# Patient Record
Sex: Female | Born: 1990
Health system: Southern US, Community
[De-identification: ages and names within clinical notes are randomized; demographics above are authoritative.]

## PROBLEM LIST (undated history)

## (undated) DIAGNOSIS — Z3490 Encounter for supervision of normal pregnancy, unspecified, unspecified trimester: Secondary | ICD-10-CM

## (undated) DIAGNOSIS — Z789 Other specified health status: Secondary | ICD-10-CM

## (undated) DIAGNOSIS — R87629 Unspecified abnormal cytological findings in specimens from vagina: Secondary | ICD-10-CM

## (undated) DIAGNOSIS — O4690 Antepartum hemorrhage, unspecified, unspecified trimester: Secondary | ICD-10-CM

## (undated) HISTORY — DX: Unspecified abnormal cytological findings in specimens from vagina: R87.629

## (undated) HISTORY — PX: WISDOM TOOTH EXTRACTION: SHX21

## (undated) HISTORY — DX: Other specified health status: Z78.9

## (undated) HISTORY — DX: Antepartum hemorrhage, unspecified, unspecified trimester: O46.90

## (undated) HISTORY — DX: Encounter for supervision of normal pregnancy, unspecified, unspecified trimester: Z34.90

---

## 2010-12-02 ENCOUNTER — Emergency Department (HOSPITAL_COMMUNITY)
Admission: EM | Admit: 2010-12-02 | Discharge: 2010-12-02 | Disposition: A | Discharge status: Home / Self Care | Payer: Self-pay | Attending: Emergency Medicine | Admitting: Emergency Medicine

## 2010-12-02 DIAGNOSIS — H6500 Acute serous otitis media, unspecified ear: Secondary | ICD-10-CM | POA: Insufficient documentation

## 2010-12-02 DIAGNOSIS — H9209 Otalgia, unspecified ear: Secondary | ICD-10-CM | POA: Insufficient documentation

## 2015-02-09 ENCOUNTER — Encounter: Payer: Self-pay | Admitting: Adult Health

## 2015-02-10 ENCOUNTER — Ambulatory Visit (INDEPENDENT_AMBULATORY_CARE_PROVIDER_SITE_OTHER): Payer: BLUE CROSS/BLUE SHIELD | Admitting: Adult Health

## 2015-02-10 ENCOUNTER — Encounter: Payer: Self-pay | Admitting: Adult Health

## 2015-02-10 VITALS — BP 130/60 | HR 96 | Ht 64.0 in | Wt 176.0 lb

## 2015-02-10 DIAGNOSIS — N926 Irregular menstruation, unspecified: Secondary | ICD-10-CM

## 2015-02-10 DIAGNOSIS — Z3201 Encounter for pregnancy test, result positive: Secondary | ICD-10-CM | POA: Diagnosis not present

## 2015-02-10 DIAGNOSIS — Z3400 Encounter for supervision of normal first pregnancy, unspecified trimester: Secondary | ICD-10-CM | POA: Insufficient documentation

## 2015-02-10 DIAGNOSIS — Z3490 Encounter for supervision of normal pregnancy, unspecified, unspecified trimester: Secondary | ICD-10-CM

## 2015-02-10 DIAGNOSIS — O3680X0 Pregnancy with inconclusive fetal viability, not applicable or unspecified: Secondary | ICD-10-CM

## 2015-02-10 HISTORY — DX: Encounter for supervision of normal pregnancy, unspecified, unspecified trimester: Z34.90

## 2015-02-10 LAB — POCT URINE PREGNANCY: Preg Test, Ur: POSITIVE — AB

## 2015-02-10 MED ORDER — PRENATAL PLUS 27-1 MG PO TABS: 1.0000 | ORAL_TABLET | Freq: Every day | ORAL | Status: DC

## 2015-02-10 NOTE — Progress Notes (Signed)
Subjective:     Patient ID: Lynn Hale, female   DOB: [birth year], 24 y.o.   MRN: [id number]  HPI Lynn Hale is a 24 year old white female, married in for UPT, has missed a period and had 2  +HPT.Has had some loose BMs in last week or so, no bleeding, some cramps and nausea.  Review of Systems Patient denies any headaches, hearing loss, fatigue, blurred vision, shortness of breath, chest pain, abdominal pain, problems with  urination, or intercourse. No joint pain or mood swings.See HPI for positives. Reviewed past medical,surgical, social and family history. Reviewed medications and allergies.     Objective:   Physical Exam BP 130/60 mmHg  Pulse 96  Ht 5\' 4"  (1.626 m)  Wt 176 lb (79.833 kg)  BMI 30.20 kg/m2  LMP 01/09/2015 UPT +, about 4+4 weeks by LMP EDD 10/16/15, Skin warm and dry. Neck: mid line trachea, normal thyroid, good ROM, no lymphadenopathy noted. Lungs: clear to ausculation bilaterally. Cardiovascular: regular rate and rhythm.abdomen soft, non tender, ask about cruise in October to [place] told them not to go due to [place] virus.    Assessment:     Pregnant     Plan:    Eat every 2 hours, try lemon drops  Return in 2 weeks for dating [place] Review handout on first trimester and new OB packet given Rx prenatal plus #30 take 1 daily with 11 refills Ok to take tums,tylenol and imodium if needed

## 2015-02-10 NOTE — Patient Instructions (Signed)
First Trimester of Pregnancy The first trimester of pregnancy is from week 1 until the end of week 12 (months 1 through 3). A week after a sperm fertilizes an egg, the egg will implant on the wall of the uterus. This embryo will begin to develop into a baby. Genes from you and your partner are forming the baby. The female genes determine whether the baby is a boy or a girl. At 6-8 weeks, the eyes and face are formed, and the heartbeat can be seen on ultrasound. At the end of 12 weeks, all the baby's organs are formed.  Now that you are pregnant, you will want to do everything you can to have a healthy baby. Two of the most important things are to get good prenatal care and to follow your health care provider's instructions. Prenatal care is all the medical care you receive before the baby's birth. This care will help prevent, find, and treat any problems during the pregnancy and childbirth. BODY CHANGES Your body goes through many changes during pregnancy. The changes vary from woman to woman.   You may gain or lose a couple of pounds at first.  You may feel sick to your stomach (nauseous) and throw up (vomit). If the vomiting is uncontrollable, call your health care provider.  You may tire easily.  You may develop headaches that can be relieved by medicines approved by your health care provider.  You may urinate more often. Painful urination may mean you have a bladder infection.  You may develop heartburn as a result of your pregnancy.  You may develop constipation because certain hormones are causing the muscles that push waste through your intestines to slow down.  You may develop hemorrhoids or swollen, bulging veins (varicose veins).  Your breasts may begin to grow larger and become tender. Your nipples may stick out more, and the tissue that surrounds them (areola) may become darker.  Your gums may bleed and may be sensitive to brushing and flossing.  Dark spots or blotches (chloasma,  mask of pregnancy) may develop on your face. This will likely fade after the baby is born.  Your menstrual periods will stop.  You may have a loss of appetite.  You may develop cravings for certain kinds of food.  You may have changes in your emotions from day to day, such as being excited to be pregnant or being concerned that something may go wrong with the pregnancy and baby.  You may have more vivid and strange dreams.  You may have changes in your hair. These can include thickening of your hair, rapid growth, and changes in texture. Some women also have hair loss during or after pregnancy, or hair that feels dry or thin. Your hair will most likely return to normal after your baby is born. WHAT TO EXPECT AT YOUR PRENATAL VISITS During a routine prenatal visit:  You will be weighed to make sure you and the baby are growing normally.  Your blood pressure will be taken.  Your abdomen will be measured to track your baby's growth.  The fetal heartbeat will be listened to starting around week 10 or 12 of your pregnancy.  Test results from any previous visits will be discussed. Your health care provider may ask you:  How you are feeling.  If you are feeling the baby move.  If you have had any abnormal symptoms, such as leaking fluid, bleeding, severe headaches, or abdominal cramping.  If you have any questions. Other tests   that may be performed during your first trimester include:  Blood tests to find your blood type and to check for the presence of any previous infections. They will also be used to check for low iron levels (anemia) and Rh antibodies. Later in the pregnancy, blood tests for diabetes will be done along with other tests if problems develop.  Urine tests to check for infections, diabetes, or protein in the urine.  An ultrasound to confirm the proper growth and development of the baby.  An amniocentesis to check for possible genetic problems.  Fetal screens for  spina bifida and Down syndrome.  You may need other tests to make sure you and the baby are doing well. HOME CARE INSTRUCTIONS  Medicines  Follow your health care provider's instructions regarding medicine use. Specific medicines may be either safe or unsafe to take during pregnancy.  Take your prenatal vitamins as directed.  If you develop constipation, try taking a stool softener if your health care provider approves. Diet  Eat regular, well-balanced meals. Choose a variety of foods, such as meat or vegetable-based protein, fish, milk and low-fat dairy products, vegetables, fruits, and whole grain breads and cereals. Your health care provider will help you determine the amount of weight gain that is right for you.  Avoid raw meat and uncooked cheese. These carry germs that can cause birth defects in the baby.  Eating four or five small meals rather than three large meals a day may help relieve nausea and vomiting. If you start to feel nauseous, eating a few soda crackers can be helpful. Drinking liquids between meals instead of during meals also seems to help nausea and vomiting.  If you develop constipation, eat more high-fiber foods, such as fresh vegetables or fruit and whole grains. Drink enough fluids to keep your urine clear or pale yellow. Activity and Exercise  Exercise only as directed by your health care provider. Exercising will help you:  Control your weight.  Stay in shape.  Be prepared for labor and delivery.  Experiencing pain or cramping in the lower abdomen or low back is a good sign that you should stop exercising. Check with your health care provider before continuing normal exercises.  Try to avoid standing for long periods of time. Move your legs often if you must stand in one place for a long time.  Avoid heavy lifting.  Wear low-heeled shoes, and practice good posture.  You may continue to have sex unless your health care provider directs you  otherwise. Relief of Pain or Discomfort  Wear a good support bra for breast tenderness.   Take warm sitz baths to soothe any pain or discomfort caused by hemorrhoids. Use hemorrhoid cream if your health care provider approves.   Rest with your legs elevated if you have leg cramps or low back pain.  If you develop varicose veins in your legs, wear support hose. Elevate your feet for 15 minutes, 3-4 times a day. Limit salt in your diet. Prenatal Care  Schedule your prenatal visits by the twelfth week of pregnancy. They are usually scheduled monthly at first, then more often in the last 2 months before delivery.  Write down your questions. Take them to your prenatal visits.  Keep all your prenatal visits as directed by your health care provider. Safety  Wear your seat belt at all times when driving.  Make a list of emergency phone numbers, including numbers for family, friends, the hospital, and police and fire departments. General Tips    Ask your health care provider for a referral to a local prenatal education class. Begin classes no later than at the beginning of month 6 of your pregnancy.  Ask for help if you have counseling or nutritional needs during pregnancy. Your health care provider can offer advice or refer you to specialists for help with various needs.  Do not use hot tubs, steam rooms, or saunas.  Do not douche or use tampons or scented sanitary pads.  Do not cross your legs for long periods of time.  Avoid cat litter boxes and soil used by cats. These carry germs that can cause birth defects in the baby and possibly loss of the fetus by miscarriage or stillbirth.  Avoid all smoking, herbs, alcohol, and medicines not prescribed by your health care provider. Chemicals in these affect the formation and growth of the baby.  Schedule a dentist appointment. At home, brush your teeth with a soft toothbrush and be gentle when you floss. SEEK MEDICAL CARE IF:   You have  dizziness.  You have mild pelvic cramps, pelvic pressure, or nagging pain in the abdominal area.  You have persistent nausea, vomiting, or diarrhea.  You have a bad smelling vaginal discharge.  You have pain with urination.  You notice increased swelling in your face, hands, legs, or ankles. SEEK IMMEDIATE MEDICAL CARE IF:   You have a fever.  You are leaking fluid from your vagina.  You have spotting or bleeding from your vagina.  You have severe abdominal cramping or pain.  You have rapid weight gain or loss.  You vomit blood or material that looks like coffee grounds.  You are exposed to [place] measles and have never had them.  You are exposed to fifth disease or chickenpox.  You develop a severe headache.  You have shortness of breath.  You have any kind of trauma, such as from a fall or a car accident. Document Released: 07/25/2001 Document Revised: 12/15/2013 Document Reviewed: 06/10/2013 [facility] Patient Information 2015 [place], [place]. This information is not intended to replace advice given to you by your health care provider. Make sure you discuss any questions you have with your health care provider. Return in 2 weeks for dating [place]

## 2015-02-16 ENCOUNTER — Telehealth: Payer: Self-pay | Admitting: Adult Health

## 2015-02-16 NOTE — Telephone Encounter (Signed)
Had lite spotting to come in tomorrow , no sex

## 2015-02-17 ENCOUNTER — Encounter: Payer: Self-pay | Admitting: Adult Health

## 2015-02-17 ENCOUNTER — Ambulatory Visit (INDEPENDENT_AMBULATORY_CARE_PROVIDER_SITE_OTHER): Payer: BLUE CROSS/BLUE SHIELD | Admitting: Adult Health

## 2015-02-17 VITALS — BP 110/72 | HR 92 | Ht 64.0 in | Wt 174.0 lb

## 2015-02-17 DIAGNOSIS — Z1389 Encounter for screening for other disorder: Secondary | ICD-10-CM

## 2015-02-17 DIAGNOSIS — Z331 Pregnant state, incidental: Secondary | ICD-10-CM | POA: Diagnosis not present

## 2015-02-17 DIAGNOSIS — O4691 Antepartum hemorrhage, unspecified, first trimester: Secondary | ICD-10-CM

## 2015-02-17 DIAGNOSIS — O200 Threatened abortion: Secondary | ICD-10-CM

## 2015-02-17 DIAGNOSIS — O209 Hemorrhage in early pregnancy, unspecified: Secondary | ICD-10-CM

## 2015-02-17 DIAGNOSIS — O4690 Antepartum hemorrhage, unspecified, unspecified trimester: Secondary | ICD-10-CM | POA: Insufficient documentation

## 2015-02-17 HISTORY — DX: Antepartum hemorrhage, unspecified, unspecified trimester: O46.90

## 2015-02-17 LAB — POCT URINALYSIS DIPSTICK
Glucose, UA: NEGATIVE
Ketones, UA: NEGATIVE
NITRITE UA: NEGATIVE
Protein, UA: NEGATIVE

## 2015-02-17 NOTE — Progress Notes (Addendum)
Subjective:     Patient ID: [name], female   DOB: 08/06/1991, 24 y.o.   MRN: [id number]  HPI Meshia is a 24 year old white female, married in complaining of vaginal bleeding Monday night, stopped last night.She had +UPT last week, is about [redacted] weeks pregnant.  Review of Systems Patient denies any headaches, hearing loss, fatigue, blurred vision, shortness of breath, chest pain, abdominal pain, problems with bowel movements, urination, or intercourse. No joint pain or mood swings.See HPI for positives.   Reviewed past medical,surgical, social and family history. Reviewed medications and allergies.     Objective:   Physical Exam BP 110/72 mmHg  Pulse 92  Ht 5\' 4"  (1.626 m)  Wt 174 lb (78.926 kg)  BMI 29.85 kg/m2  LMP 01/09/2015 urine trace of blood and 1+ leuks, Skin warm and dry.Pelvic: external genitalia is normal in appearance no lesions, vagina: tan discharge without odor,urethra has no lesions or masses noted, cervix is everted at os, closed, uterus: normal size, shape and contour, non tender, no masses felt, adnexa: no masses or tenderness noted. Bladder is non tender and no masses felt. US shows IUP with YS and fetal pole of about 6 weeks, +FHM 124, No [place] seen.    Assessment:    Vaginal bleeding in first trimester    Plan:     No sex, no straining or lifting over 25 lbs Return as scheduled for [place] 7/13

## 2015-02-17 NOTE — Patient Instructions (Signed)
No sex or straining No lifting over 25 lbs Keep appt as scheduled

## 2015-02-18 ENCOUNTER — Telehealth: Payer: Self-pay | Admitting: Adult Health

## 2015-02-18 MED ORDER — PROMETHAZINE HCL 25 MG PO TABS: 25.0000 mg | ORAL_TABLET | Freq: Four times a day (QID) | ORAL | Status: DC | PRN

## 2015-02-18 NOTE — Telephone Encounter (Signed)
Left message phenergan called in

## 2015-02-18 NOTE — Telephone Encounter (Signed)
Spoke with pt. Pt is requesting med for nausea. Thanks! [place]

## 2015-02-21 ENCOUNTER — Encounter (HOSPITAL_COMMUNITY): Payer: Self-pay

## 2015-02-21 DIAGNOSIS — Z3A01 Less than 8 weeks gestation of pregnancy: Secondary | ICD-10-CM | POA: Insufficient documentation

## 2015-02-21 DIAGNOSIS — O3481 Maternal care for other abnormalities of pelvic organs, first trimester: Secondary | ICD-10-CM | POA: Insufficient documentation

## 2015-02-21 DIAGNOSIS — N8329 Other ovarian cysts: Secondary | ICD-10-CM | POA: Insufficient documentation

## 2015-02-21 DIAGNOSIS — Z79899 Other long term (current) drug therapy: Secondary | ICD-10-CM | POA: Diagnosis not present

## 2015-02-21 LAB — COMPREHENSIVE METABOLIC PANEL
ALT: 11 U/L — AB (ref 14–54)
ANION GAP: 8 (ref 5–15)
AST: 14 U/L — ABNORMAL LOW (ref 15–41)
Albumin: 3.9 g/dL (ref 3.5–5.0)
Alkaline Phosphatase: 43 U/L (ref 38–126)
BUN: 8 mg/dL (ref 6–20)
CO2: 23 mmol/L (ref 22–32)
Calcium: 9 mg/dL (ref 8.9–10.3)
Chloride: 104 mmol/L (ref 101–111)
Creatinine, Ser: 0.53 mg/dL (ref 0.44–1.00)
Glucose, Bld: 92 mg/dL (ref 65–99)
Potassium: 3.8 mmol/L (ref 3.5–5.1)
SODIUM: 135 mmol/L (ref 135–145)
Total Bilirubin: 0.4 mg/dL (ref 0.3–1.2)
Total Protein: 6.9 g/dL (ref 6.5–8.1)

## 2015-02-21 LAB — URINALYSIS, ROUTINE W REFLEX MICROSCOPIC
BILIRUBIN URINE: NEGATIVE
Glucose, UA: NEGATIVE mg/dL
Ketones, ur: NEGATIVE mg/dL
Nitrite: NEGATIVE
PROTEIN: NEGATIVE mg/dL
Specific Gravity, Urine: 1.019 (ref 1.005–1.030)
Urobilinogen, UA: 0.2 mg/dL (ref 0.0–1.0)
pH: 6 (ref 5.0–8.0)

## 2015-02-21 LAB — CBC WITH DIFFERENTIAL/PLATELET
Basophils Absolute: 0 K/uL (ref 0.0–0.1)
Basophils Relative: 0 % (ref 0–1)
Eosinophils Absolute: 0.3 K/uL (ref 0.0–0.7)
Eosinophils Relative: 2 % (ref 0–5)
HCT: 38 % (ref 36.0–46.0)
Hemoglobin: 13.1 g/dL (ref 12.0–15.0)
Lymphocytes Relative: 29 % (ref 12–46)
Lymphs Abs: 3.1 K/uL (ref 0.7–4.0)
MCH: 31.6 pg (ref 26.0–34.0)
MCHC: 34.5 g/dL (ref 30.0–36.0)
MCV: 91.8 fL (ref 78.0–100.0)
Monocytes Absolute: 0.8 K/uL (ref 0.1–1.0)
Monocytes Relative: 7 % (ref 3–12)
Neutro Abs: 6.7 K/uL (ref 1.7–7.7)
Neutrophils Relative %: 62 % (ref 43–77)
Platelets: 255 K/uL (ref 150–400)
RBC: 4.14 MIL/uL (ref 3.87–5.11)
RDW: 12 % (ref 11.5–15.5)
WBC: 11 K/uL — ABNORMAL HIGH (ref 4.0–10.5)

## 2015-02-21 LAB — LIPASE, BLOOD: Lipase: 24 U/L (ref 22–51)

## 2015-02-21 LAB — URINE MICROSCOPIC-ADD ON

## 2015-02-21 NOTE — ED Notes (Signed)
Pt here for abd pain in right lower quadrant increased with standing, does not radiate, denies any vaginal bleeding this week, reports she is [redacted] weeks pregnant with first pregnancy

## 2015-02-21 NOTE — ED Notes (Addendum)
sts it does come and go similar to cramps but sharper in nature. Now reporting headache

## 2015-02-22 ENCOUNTER — Emergency Department (HOSPITAL_COMMUNITY)
Admission: EM | Admit: 2015-02-22 | Discharge: 2015-02-22 | Disposition: A | Discharge status: Home / Self Care | Payer: BLUE CROSS/BLUE SHIELD | Attending: Emergency Medicine | Admitting: Emergency Medicine

## 2015-02-22 ENCOUNTER — Emergency Department (HOSPITAL_COMMUNITY): Payer: BLUE CROSS/BLUE SHIELD

## 2015-02-22 DIAGNOSIS — N83201 Unspecified ovarian cyst, right side: Secondary | ICD-10-CM

## 2015-02-22 DIAGNOSIS — R1031 Right lower quadrant pain: Secondary | ICD-10-CM

## 2015-02-22 LAB — GC/CHLAMYDIA PROBE AMP (~~LOC~~) NOT AT ARMC
Chlamydia: NEGATIVE
NEISSERIA GONORRHEA: NEGATIVE

## 2015-02-22 LAB — WET PREP, GENITAL
Clue Cells Wet Prep HPF POC: NONE SEEN
Trich, Wet Prep: NONE SEEN
Yeast Wet Prep HPF POC: NONE SEEN

## 2015-02-22 MED ORDER — FOSFOMYCIN TROMETHAMINE 3 G PO PACK
3.0000 g | PACK | Freq: Once | ORAL | Status: AC
  Administered 2015-02-22: 3 g via ORAL
  Filled 2015-02-22: qty 3

## 2015-02-22 MED ORDER — ACETAMINOPHEN 500 MG PO TABS
1000.0000 mg | ORAL_TABLET | Freq: Once | ORAL | Status: AC
  Administered 2015-02-22: 1000 mg via ORAL
  Filled 2015-02-22: qty 2

## 2015-02-22 NOTE — ED Provider Notes (Signed)
CSN: [id number]     Arrival date & time 02/21/15  2139 History   This chart was scribed for Lynn Balls, MD by Forrestine Him, ED Scribe. This patient was seen in room D35C/D35C and the patient's care was started 12:51 AM.   Chief Complaint  Patient presents with  . Abdominal Pain   The history is provided by the patient. No language interpreter was used.    HPI Comments: Lynn Hale, G1P0, [redacted] weeks gestation is a 24 y.o. female who presents to the Emergency Department complaining of intermittent, ongoing, non-radiating, unchanged lower abdominal pain onset 3 PM this afternoon. Pain is described as sharp and similar to a cramp. Pt called on call OB/GYN nurse and was advised to come in to emergency department for further evaluation. No OTC medications or home remedies attempted prior to arrival. No vaginal discharge or vaginal bleeding. No leakage noted. No fever, chills, nausea, or vomiting. No known allergies to medications.  Past Medical History  Diagnosis Date  . Pregnant 02/10/2015  . Vaginal bleeding in pregnancy 02/17/2015   Past Surgical History  Procedure Laterality Date  . Wisdom tooth extraction     Family History  Problem Relation Age of Onset  . Cancer Mother     ovarian  . Diabetes Maternal Grandmother   . Glaucoma Paternal Grandmother   . Dementia Paternal Grandfather    History  Substance Use Topics  . Smoking status: Never Smoker   . Smokeless tobacco: Never Used  . Alcohol Use: No     Comment: not now   OB History    Gravida Para Term Preterm AB TAB SAB Ectopic Multiple Living   1              Review of Systems  Constitutional: Negative for fever and chills.  Respiratory: Negative for cough and shortness of breath.   Cardiovascular: Negative for chest pain.  Gastrointestinal: Positive for abdominal pain. Negative for nausea, vomiting and diarrhea.  Genitourinary: Negative for vaginal bleeding and vaginal discharge.  Neurological: Negative for  dizziness, weakness and numbness.  Psychiatric/Behavioral: Negative for confusion.  All other systems reviewed and are negative.     Allergies  Review of patient's allergies indicates no known allergies.  Home Medications   Prior to Admission medications   Medication Sig Start Date End Date Taking? Authorizing Provider  prenatal vitamin w/FE, FA (PRENATAL 1 + 1) 27-1 MG TABS tablet Take 1 tablet by mouth daily at 12 noon. 02/10/15   Estill Dooms, NP  promethazine (PHENERGAN) 25 MG tablet Take 1 tablet (25 mg total) by mouth every 6 (six) hours as needed for nausea or vomiting. 02/18/15   Estill Dooms, NP   Triage Vitals: BP 123/64 mmHg  Pulse 91  Temp(Src) 98.2 F (36.8 C) (Oral)  Resp 18  Ht 5\' 4"  (1.626 m)  Wt 174 lb (78.926 kg)  BMI 29.85 kg/m2  SpO2 100%  LMP 01/09/2015   Physical Exam  Constitutional: She is oriented to person, place, and time. She appears well-developed and well-nourished. No distress.  HENT:  Head: Normocephalic and atraumatic.  Nose: Nose normal.  Mouth/Throat: Oropharynx is clear and moist. No oropharyngeal exudate.  Eyes: Conjunctivae and EOM are normal. Pupils are equal, round, and reactive to light. No scleral icterus.  Neck: Normal range of motion. Neck supple. No JVD present. No tracheal deviation present. No thyromegaly present.  Cardiovascular: Normal rate, regular rhythm and normal heart sounds.  Exam reveals no gallop and  no friction rub.   No murmur heard. Pulmonary/Chest: Effort normal and breath sounds normal. No respiratory distress. She has no wheezes. She exhibits no tenderness.  Abdominal: Soft. Bowel sounds are normal. She exhibits no distension and no mass. There is tenderness. There is no rebound and no guarding.  RLQ tenderness to palpation   Genitourinary:  Mild brown vaginal discharge No CMT No bleeding Could assess cervical os as it was extremely posterior  No adnexal tenderness   Musculoskeletal: Normal range of  motion. She exhibits no edema or tenderness.  Lymphadenopathy:    She has no cervical adenopathy.  Neurological: She is alert and oriented to person, place, and time. No cranial nerve deficit. She exhibits normal muscle tone.  Skin: Skin is warm and dry. No rash noted. No erythema. No pallor.  Nursing note and vitals reviewed.   ED Course  Procedures (including critical care time)  DIAGNOSTIC STUDIES: Oxygen Saturation is 100% on RA, Normal by my interpretation.    COORDINATION OF CARE: 1:06 AM- Will order CMP, Lipase, CBC, urinalysis. Will give Tylenol. Will perform pelvic exam. Discussed treatment plan with pt at bedside and pt agreed to plan.     Labs Review Labs Reviewed  WET PREP, GENITAL - Abnormal; Notable for the following:    WBC, Wet Prep HPF POC MODERATE (*)    All other components within normal limits  COMPREHENSIVE METABOLIC PANEL - Abnormal; Notable for the following:    AST 14 (*)    ALT 11 (*)    All other components within normal limits  CBC WITH DIFFERENTIAL/PLATELET - Abnormal; Notable for the following:    WBC 11.0 (*)    All other components within normal limits  URINALYSIS, ROUTINE W REFLEX MICROSCOPIC (NOT AT [facility]) - Abnormal; Notable for the following:    Hgb urine dipstick TRACE (*)    Leukocytes, UA SMALL (*)    All other components within normal limits  URINE MICROSCOPIC-ADD ON - Abnormal; Notable for the following:    Squamous Epithelial / LPF FEW (*)    All other components within normal limits  LIPASE, BLOOD  GC/CHLAMYDIA PROBE AMP ([facility]) NOT AT [facility]    Imaging Review US Ob Comp Less 14 Wks  02/22/2015   CLINICAL DATA:  Vaginal bleeding, RIGHT lower quadrant pain, first trimester pregnancy. Gestational age by last menstrual period 6 weeks and 1 day.  EXAM: OBSTETRIC <14 WK [place] AND TRANSVAGINAL OB US  TECHNIQUE: Both transabdominal and transvaginal ultrasound examinations were performed for complete evaluation of the gestation as well as  the maternal uterus, adnexal regions, and pelvic cul-de-sac. Transvaginal technique was performed to assess early pregnancy.  COMPARISON:  None.  FINDINGS: Intrauterine gestational sac: Visualized/normal in shape.  Yolk sac:  Present  Embryo:  Present  Cardiac Activity: Present  Heart Rate: 155  bpm  CRL: 10 .4 mm 7 w 1 d [place] EDC: October 10, 2015  Maternal uterus/adnexae: No subchorionic hemorrhage. 4.7 cm anechoic RIGHT ovarian cyst with increased through transmission, no vascularity. Normal appearance of the LEFT adnexae. No free fluid.  IMPRESSION: Single live intrauterine pregnancy, gestational age by ultrasound 7 weeks and 1 day without immediate complications.  4.7 cm RIGHT ovarian simple cyst.   Electronically Signed   By: Elon Alas M.D.   On: 02/22/2015 02:32   US Ob Transvaginal  02/22/2015   CLINICAL DATA:  Vaginal bleeding, RIGHT lower quadrant pain, first trimester pregnancy. Gestational age by last menstrual period 6 weeks and  1 day.  EXAM: OBSTETRIC <14 WK [place] AND TRANSVAGINAL OB US  TECHNIQUE: Both transabdominal and transvaginal ultrasound examinations were performed for complete evaluation of the gestation as well as the maternal uterus, adnexal regions, and pelvic cul-de-sac. Transvaginal technique was performed to assess early pregnancy.  COMPARISON:  None.  FINDINGS: Intrauterine gestational sac: Visualized/normal in shape.  Yolk sac:  Present  Embryo:  Present  Cardiac Activity: Present  Heart Rate: 155  bpm  CRL: 10 .4 mm 7 w 1 d [place] EDC: October 10, 2015  Maternal uterus/adnexae: No subchorionic hemorrhage. 4.7 cm anechoic RIGHT ovarian cyst with increased through transmission, no vascularity. Normal appearance of the LEFT adnexae. No free fluid.  IMPRESSION: Single live intrauterine pregnancy, gestational age by ultrasound 7 weeks and 1 day without immediate complications.  4.7 cm RIGHT ovarian simple cyst.   Electronically Signed   By: Elon Alas M.D.   On: 02/22/2015 02:32    US Abdomen Limited  02/22/2015   CLINICAL DATA:  Right lower quadrant pain  EXAM: LIMITED ABDOMINAL ULTRASOUND  TECHNIQUE: Pearline Cables scale imaging of the right lower quadrant was performed to evaluate for suspected appendicitis. Standard imaging planes and graded compression technique were utilized.  COMPARISON:  None.  FINDINGS: The appendix is not visualized.  Ancillary findings: None. No fluid collections or inflammatory changes in the visualized field of view.  Factors affecting image quality: Body habitus related to current pregnancy.  IMPRESSION: Appendix is not identified and therefore is indeterminate. No specific evidence of infection is identified.   Electronically Signed   By: Lucienne Capers M.D.   On: 02/22/2015 02:55     EKG Interpretation None      MDM   Final diagnoses:  None   Patient presents to the emergency department for right lower quadrant abdominal pain since 3 PM intermittently. This is in the setting of 6 weeks pregnancy. Patient states she's had a normal ultrasound during this pregnancy. Pelvic exam shows abnormal discharge. Urinalysis shows rare bacteria. Will obtain abdominal ultrasound to assess for appendicitis and for live IUP. Patient was given Tylenol for pain. She will likely require antibiotics for asymptomatic bacteria in the setting of pregnancy.  Patient was given fosfomycin emergency department. Ultrasound does not identify the appendix, patient was educated on symptoms and told to return for repeat evaluation if she develops symptoms of appendicitis. Ultrasound reveals right ovarian cyst, this is likely the source of her pain. OB/GYN follow-up is advised in 3 days. She otherwise appears well in no acute distress. Her vital signs were within her normal limits and she is safe for discharge. Ultrasound also reveals live IUP at 7 weeks and 1 day.   I personally performed the services described in this documentation, which was scribed in my presence. The  recorded information has been reviewed and is accurate.   Lynn Balls, MD 02/22/15 [phone]

## 2015-02-22 NOTE — Discharge Instructions (Signed)
Ovarian Cyst Ms. Lynn Hale, your ultrasound results are below. See your OB/GYN physician within 3 days for close follow-up. If you develop symptoms of appendicitis such as fever and vomiting come back to the emergency department immediately. Thank you.  IMPRESSION: Single live intrauterine pregnancy, gestational age by ultrasound 7 weeks and 1 day without immediate complications.  4.7 cm RIGHT ovarian simple cyst.   An ovarian cyst is a sac filled with fluid or blood. This sac is attached to the ovary. Some cysts go away on their own. Other cysts need treatment.  HOME CARE   Only take medicine as told by your doctor.  Follow up with your doctor as told.  Get regular pelvic exams and Pap tests. GET HELP IF:  Your periods are late, not regular, or painful.  You stop having periods.  Your belly (abdominal) or pelvic pain does not go away.  Your belly becomes large or puffy (swollen).  You have a hard time peeing (totally emptying your bladder).  You have pressure on your bladder.  You have pain during sex.  You feel fullness, pressure, or discomfort in your belly.  You lose weight for no reason.  You feel sick most of the time.  You have a hard time pooping (constipation).  You do not feel like eating.  You develop pimples (acne).  You have an increase in hair on your body and face.  You are gaining weight for no reason.  You think you are pregnant. GET HELP RIGHT AWAY IF:   Your belly pain gets worse.  You feel sick to your stomach (nauseous), and you throw up (vomit).  You have a fever that comes on fast.  You have belly pain while pooping (bowel movement).  Your periods are heavier than usual. MAKE SURE YOU:   Understand these instructions.  Will watch your condition.  Will get help right away if you are not doing well or get worse. Document Released: 01/17/2008 Document Revised: 05/21/2013 Document Reviewed: 04/07/2013 [facility] Patient  Information 2015 [place], [place]. This information is not intended to replace advice given to you by your health care provider. Make sure you discuss any questions you have with your health care provider.

## 2015-02-22 NOTE — ED Notes (Signed)
Pt left with all belongings and in the care of family. Pt refused wheelchair.

## 2015-02-24 ENCOUNTER — Other Ambulatory Visit: Payer: BLUE CROSS/BLUE SHIELD

## 2015-03-03 ENCOUNTER — Encounter: Payer: BLUE CROSS/BLUE SHIELD | Admitting: Advanced Practice Midwife

## 2015-03-10 ENCOUNTER — Encounter: Payer: Self-pay | Admitting: Women's Health

## 2015-03-10 ENCOUNTER — Ambulatory Visit (INDEPENDENT_AMBULATORY_CARE_PROVIDER_SITE_OTHER): Payer: BLUE CROSS/BLUE SHIELD | Admitting: Women's Health

## 2015-03-10 VITALS — BP 100/62 | HR 76 | Ht 65.0 in | Wt 171.0 lb

## 2015-03-10 DIAGNOSIS — Z3401 Encounter for supervision of normal first pregnancy, first trimester: Secondary | ICD-10-CM

## 2015-03-10 DIAGNOSIS — Z331 Pregnant state, incidental: Secondary | ICD-10-CM

## 2015-03-10 DIAGNOSIS — Z0283 Encounter for blood-alcohol and blood-drug test: Secondary | ICD-10-CM

## 2015-03-10 DIAGNOSIS — Z1389 Encounter for screening for other disorder: Secondary | ICD-10-CM

## 2015-03-10 DIAGNOSIS — Z369 Encounter for antenatal screening, unspecified: Secondary | ICD-10-CM

## 2015-03-10 DIAGNOSIS — Z3682 Encounter for antenatal screening for nuchal translucency: Secondary | ICD-10-CM

## 2015-03-10 DIAGNOSIS — Z8279 Family history of other congenital malformations, deformations and chromosomal abnormalities: Secondary | ICD-10-CM

## 2015-03-10 LAB — POCT URINALYSIS DIPSTICK
GLUCOSE UA: NEGATIVE
Ketones, UA: NEGATIVE
Leukocytes, UA: NEGATIVE
NITRITE UA: NEGATIVE
PROTEIN UA: NEGATIVE
RBC UA: NEGATIVE

## 2015-03-10 MED ORDER — DOXYLAMINE-PYRIDOXINE 10-10 MG PO TBEC: DELAYED_RELEASE_TABLET | ORAL | Status: DC

## 2015-03-10 NOTE — Progress Notes (Addendum)
  Subjective:  Lynn Hale is a 24 y.o. G43P0 Caucasian female at [redacted]w[redacted]d by 7wk u/s, being seen today for her first obstetrical visit.  Her obstetrical history is significant for primigravida.  Pregnancy history fully reviewed.  Patient reports nausea- phenergan not really helping, only vomited x 1. Denies vb, cramping, uti s/s, abnormal/malodorous vag d/c, or vulvovaginal itching/irritation.  BP 100/62 mmHg  Pulse 76  Wt 171 lb (77.565 kg)  LMP 01/09/2015  HISTORY: OB History  Gravida Para Term Preterm AB SAB TAB Ectopic Multiple Living  1             # Outcome Date GA Lbr Len/2nd Weight Sex Delivery Anes PTL Lv  1 Current              Past Medical History  Diagnosis Date  . Pregnant 02/10/2015  . Vaginal bleeding in pregnancy 02/17/2015  . Medical history non-contributory    Past Surgical History  Procedure Laterality Date  . Wisdom tooth extraction     Family History  Problem Relation Age of Onset  . Cancer Mother     ovarian  . Diabetes Maternal Grandmother   . Heart disease Maternal Grandmother   . Glaucoma Paternal Grandmother   . Dementia Paternal Grandfather     Exam   System:     General: Well developed & nourished, no acute distress   Skin: Warm & dry, normal coloration and turgor, no rashes   Neurologic: Alert & oriented, normal mood   Cardiovascular: Regular rate & rhythm   Respiratory: Effort & rate normal, LCTAB, acyanotic   Abdomen: Soft, non tender   Extremities: normal strength, tone  Thin prep pap smear neg 2-71yrs ago at [facility]  FHR: 166 via doppler   Assessment:   Pregnancy: G1P0 Patient Active Problem List   Diagnosis Date Noted  . Supervision of normal first pregnancy 02/10/2015    Priority: High  . Vaginal bleeding in pregnancy 02/17/2015    [redacted]w[redacted]d G1P0 New OB visit Nausea of pregnancy FOB born w/ VSD  Plan:  Initial labs drawn Continue prenatal vitamins Problem list reviewed and updated Reviewed n/v relief measures and  warning s/s to report Rx diclegis, coupon card given, and 2 samples given.  Reviewed recommended weight gain based on pre-gravid BMI Encouraged well-balanced diet Genetic Screening discussed Integrated Screen: requested Cystic fibrosis screening discussed requested Ultrasound discussed; fetal survey: requested Follow up in 3 weeks for 1st nt/it and visit [place] completed Request pap records from [place] 03/10/2015 9:03 AM

## 2015-03-10 NOTE — Patient Instructions (Signed)

## 2015-03-11 LAB — GC/CHLAMYDIA PROBE AMP
CHLAMYDIA, DNA PROBE: NEGATIVE
Neisseria gonorrhoeae by PCR: NEGATIVE

## 2015-03-12 LAB — URINE CULTURE

## 2015-03-18 LAB — PMP SCREEN PROFILE (10S), URINE
Amphetamine Screen, Ur: NEGATIVE ng/mL
BARBITURATE SCRN UR: NEGATIVE ng/mL
BENZODIAZEPINE SCREEN, URINE: NEGATIVE ng/mL
Cannabinoids Ur Ql Scn: NEGATIVE ng/mL
Cocaine(Metab.)Screen, Urine: NEGATIVE ng/mL
Creatinine(Crt), U: 149.6 mg/dL (ref 20.0–300.0)
Methadone Scn, Ur: NEGATIVE ng/mL
OPIATE SCRN UR: NEGATIVE ng/mL
Oxycodone+Oxymorphone Ur Ql Scn: NEGATIVE ng/mL
PCP Scrn, Ur: NEGATIVE ng/mL
PH UR, DRUG SCRN: 7.1 (ref 4.5–8.9)
PROPOXYPHENE SCREEN: NEGATIVE ng/mL

## 2015-03-18 LAB — URINALYSIS, ROUTINE W REFLEX MICROSCOPIC
BILIRUBIN UA: NEGATIVE
Glucose, UA: NEGATIVE
KETONES UA: NEGATIVE
NITRITE UA: NEGATIVE
PROTEIN UA: NEGATIVE
RBC UA: NEGATIVE
Specific Gravity, UA: 1.021 (ref 1.005–1.030)
UUROB: 0.2 mg/dL (ref 0.2–1.0)
pH, UA: 7.5 (ref 5.0–7.5)

## 2015-03-18 LAB — CBC
HEMOGLOBIN: 12.6 g/dL (ref 11.1–15.9)
Hematocrit: 37.4 % (ref 34.0–46.6)
MCH: 31.2 pg (ref 26.6–33.0)
MCHC: 33.7 g/dL (ref 31.5–35.7)
MCV: 93 fL (ref 79–97)
Platelets: 247 10*3/uL (ref 150–379)
RBC: 4.04 x10E6/uL (ref 3.77–5.28)
RDW: 13.2 % (ref 12.3–15.4)
WBC: 6.3 10*3/uL (ref 3.4–10.8)

## 2015-03-18 LAB — MICROSCOPIC EXAMINATION: Casts: NONE SEEN /lpf

## 2015-03-18 LAB — HEPATITIS B SURFACE ANTIGEN: HEP B S AG: NEGATIVE

## 2015-03-18 LAB — ABO/RH: Rh Factor: POSITIVE

## 2015-03-18 LAB — ANTIBODY SCREEN: ANTIBODY SCREEN: NEGATIVE

## 2015-03-18 LAB — CYSTIC FIBROSIS MUTATION 97: GENE DIS ANAL CARRIER INTERP BLD/T-IMP: NOT DETECTED

## 2015-03-18 LAB — RUBELLA SCREEN: Rubella Antibodies, IGG: <0.9 index — ABNORMAL LOW (ref >0.99)

## 2015-03-18 LAB — RPR: RPR: NONREACTIVE

## 2015-03-18 LAB — VARICELLA ZOSTER ANTIBODY, IGG: Varicella zoster IgG: 754 index (ref >165)

## 2015-03-18 LAB — HIV ANTIBODY (ROUTINE TESTING W REFLEX): HIV Screen 4th Generation wRfx: NONREACTIVE

## 2015-03-22 ENCOUNTER — Telehealth: Payer: Self-pay | Admitting: Women's Health

## 2015-03-22 MED ORDER — DOXYLAMINE-PYRIDOXINE 10-10 MG PO TBEC: DELAYED_RELEASE_TABLET | ORAL | Status: DC

## 2015-03-22 NOTE — Telephone Encounter (Signed)
Spoke with pt. Pt states she is waiting on prior approval for Diclegis. I gave 2 sample bottles, Lot # [id number], exp 01/12/16. Left note for Leah to check status of prior auth. [place]

## 2015-03-24 ENCOUNTER — Telehealth: Payer: Self-pay | Admitting: *Deleted

## 2015-03-24 ENCOUNTER — Encounter: Payer: Self-pay | Admitting: Women's Health

## 2015-03-24 DIAGNOSIS — O9989 Other specified diseases and conditions complicating pregnancy, childbirth and the puerperium: Secondary | ICD-10-CM

## 2015-03-24 DIAGNOSIS — Z283 Underimmunization status: Secondary | ICD-10-CM | POA: Insufficient documentation

## 2015-03-24 DIAGNOSIS — Z2839 Other underimmunization status: Secondary | ICD-10-CM | POA: Insufficient documentation

## 2015-03-24 NOTE — Telephone Encounter (Signed)
Explained to pt to have pharmacy run RX for Diclegis as cash pay and use the discount card Maudie Mercury gave her.  Pt verbalized understanding

## 2015-03-24 NOTE — Telephone Encounter (Signed)
LMOM for pt to call me back about her Diclegis RX, she was given a coupon card, she has BCBS so no proir auth needed.  Pharmacy can run RX as cash pay and she can use the coupon card.

## 2015-03-31 ENCOUNTER — Ambulatory Visit (INDEPENDENT_AMBULATORY_CARE_PROVIDER_SITE_OTHER): Payer: BLUE CROSS/BLUE SHIELD | Admitting: Women's Health

## 2015-03-31 ENCOUNTER — Encounter: Payer: Self-pay | Admitting: Women's Health

## 2015-03-31 ENCOUNTER — Ambulatory Visit (INDEPENDENT_AMBULATORY_CARE_PROVIDER_SITE_OTHER): Payer: BLUE CROSS/BLUE SHIELD

## 2015-03-31 VITALS — BP 100/58 | HR 68 | Wt 170.0 lb

## 2015-03-31 DIAGNOSIS — Z3401 Encounter for supervision of normal first pregnancy, first trimester: Secondary | ICD-10-CM

## 2015-03-31 DIAGNOSIS — Z3682 Encounter for antenatal screening for nuchal translucency: Secondary | ICD-10-CM

## 2015-03-31 DIAGNOSIS — Z331 Pregnant state, incidental: Secondary | ICD-10-CM

## 2015-03-31 DIAGNOSIS — Z36 Encounter for antenatal screening of mother: Secondary | ICD-10-CM | POA: Diagnosis not present

## 2015-03-31 DIAGNOSIS — Z8279 Family history of other congenital malformations, deformations and chromosomal abnormalities: Secondary | ICD-10-CM

## 2015-03-31 DIAGNOSIS — Z1389 Encounter for screening for other disorder: Secondary | ICD-10-CM

## 2015-03-31 LAB — POCT URINALYSIS DIPSTICK
Glucose, UA: NEGATIVE
KETONES UA: NEGATIVE
Leukocytes, UA: NEGATIVE
Nitrite, UA: NEGATIVE
PROTEIN UA: NEGATIVE
RBC UA: NEGATIVE

## 2015-03-31 NOTE — Progress Notes (Signed)
[place] 12+3wks single IUP pos fht 170 bpm,crl 64.59mm,nt 1.4mm,nb present,simple rt cl cyst 2.6 x 2.5 x 1.6 cm,normal lt ov

## 2015-03-31 NOTE — Progress Notes (Signed)
Low-risk OB appointment G1P0 [redacted]w[redacted]d Estimated Date of Delivery: 10/10/15 BP 100/58 mmHg  Pulse 68  Wt 170 lb (77.111 kg)  LMP 01/09/2015  BP, weight, and urine reviewed.  Refer to obstetrical flow sheet for FH & FHR.  No fm yet. Denies cramping, lof, vb, or uti s/s. No complaints. Wants to do IT bloodwork at [place] on [address]- gave papers to take w/ her Reviewed today's normal nt u/s, warning s/s to report. Plan:  Continue routine obstetrical care  F/U in 4wks for OB appointment and 2nd IT 1st IT/NT today

## 2015-03-31 NOTE — Patient Instructions (Signed)
Second Trimester of Pregnancy The second trimester is from week 13 through week 28, months 4 through 6. The second trimester is often a time when you feel your best. Your body has also adjusted to being pregnant, and you begin to feel better physically. Usually, morning sickness has lessened or quit completely, you may have more energy, and you may have an increase in appetite. The second trimester is also a time when the fetus is growing rapidly. At the end of the sixth month, the fetus is about 9 inches long and weighs about 1 pounds. You will likely begin to feel the baby move (quickening) between 18 and 20 weeks of the pregnancy. BODY CHANGES Your body goes through many changes during pregnancy. The changes vary from woman to woman.   Your weight will continue to increase. You will notice your lower abdomen bulging out.  You may begin to get stretch marks on your hips, abdomen, and breasts.  You may develop headaches that can be relieved by medicines approved by your health care provider.  You may urinate more often because the fetus is pressing on your bladder.  You may develop or continue to have heartburn as a result of your pregnancy.  You may develop constipation because certain hormones are causing the muscles that push waste through your intestines to slow down.  You may develop hemorrhoids or swollen, bulging veins (varicose veins).  You may have back pain because of the weight gain and pregnancy hormones relaxing your joints between the bones in your pelvis and as a result of a shift in weight and the muscles that support your balance.  Your breasts will continue to grow and be tender.  Your gums may bleed and may be sensitive to brushing and flossing.  Dark spots or blotches (chloasma, mask of pregnancy) may develop on your face. This will likely fade after the baby is born.  A dark line from your belly button to the pubic area (linea nigra) may appear. This will likely fade  after the baby is born.  You may have changes in your hair. These can include thickening of your hair, rapid growth, and changes in texture. Some women also have hair loss during or after pregnancy, or hair that feels dry or thin. Your hair will most likely return to normal after your baby is born. WHAT TO EXPECT AT YOUR PRENATAL VISITS During a routine prenatal visit:  You will be weighed to make sure you and the fetus are growing normally.  Your blood pressure will be taken.  Your abdomen will be measured to track your baby's growth.  The fetal heartbeat will be listened to.  Any test results from the previous visit will be discussed. Your health care provider may ask you:  How you are feeling.  If you are feeling the baby move.  If you have had any abnormal symptoms, such as leaking fluid, bleeding, severe headaches, or abdominal cramping.  If you have any questions. Other tests that may be performed during your second trimester include:  Blood tests that check for:  Low iron levels (anemia).  Gestational diabetes (between 24 and 28 weeks).  Rh antibodies.  Urine tests to check for infections, diabetes, or protein in the urine.  An ultrasound to confirm the proper growth and development of the baby.  An amniocentesis to check for possible genetic problems.  Fetal screens for spina bifida and Down syndrome. HOME CARE INSTRUCTIONS   Avoid all smoking, herbs, alcohol, and unprescribed   drugs. These chemicals affect the formation and growth of the baby.  Follow your health care provider's instructions regarding medicine use. There are medicines that are either safe or unsafe to take during pregnancy.  Exercise only as directed by your health care provider. Experiencing uterine cramps is a good sign to stop exercising.  Continue to eat regular, healthy meals.  Wear a good support bra for breast tenderness.  Do not use hot tubs, steam rooms, or saunas.  Wear your  seat belt at all times when driving.  Avoid raw meat, uncooked cheese, cat litter boxes, and soil used by cats. These carry germs that can cause birth defects in the baby.  Take your prenatal vitamins.  Try taking a stool softener (if your health care provider approves) if you develop constipation. Eat more high-fiber foods, such as fresh vegetables or fruit and whole grains. Drink plenty of fluids to keep your urine clear or pale yellow.  Take warm sitz baths to soothe any pain or discomfort caused by hemorrhoids. Use hemorrhoid cream if your health care provider approves.  If you develop varicose veins, wear support hose. Elevate your feet for 15 minutes, 3-4 times a day. Limit salt in your diet.  Avoid heavy lifting, wear low heel shoes, and practice good posture.  Rest with your legs elevated if you have leg cramps or low back pain.  Visit your dentist if you have not gone yet during your pregnancy. Use a soft toothbrush to brush your teeth and be gentle when you floss.  A sexual relationship may be continued unless your health care provider directs you otherwise.  Continue to go to all your prenatal visits as directed by your health care provider. SEEK MEDICAL CARE IF:   You have dizziness.  You have mild pelvic cramps, pelvic pressure, or nagging pain in the abdominal area.  You have persistent nausea, vomiting, or diarrhea.  You have a bad smelling vaginal discharge.  You have pain with urination. SEEK IMMEDIATE MEDICAL CARE IF:   You have a fever.  You are leaking fluid from your vagina.  You have spotting or bleeding from your vagina.  You have severe abdominal cramping or pain.  You have rapid weight gain or loss.  You have shortness of breath with chest pain.  You notice sudden or extreme swelling of your face, hands, ankles, feet, or legs.  You have not felt your baby move in over an hour.  You have severe headaches that do not go away with  medicine.  You have vision changes. Document Released: 07/25/2001 Document Revised: 08/05/2013 Document Reviewed: 10/01/2012 [employer] Patient Information 2015 [employer]. This information is not intended to replace advice given to you by your health care provider. Make sure you discuss any questions you have with your health care provider.  

## 2015-04-02 LAB — MATERNAL SCREEN, INTEGRATED #1
CROWN RUMP LENGTH MAT SCREEN: 64.9 mm
Gest. Age on Collection Date: 12.7 weeks
MATERNAL AGE AT EDD: 27 a
NUMBER OF FETUSES: 1
Nuchal Translucency (NT): 1.4 mm
PAPP-A Value: 1598.6 ng/mL
Weight: 170 [lb_av]

## 2015-04-05 ENCOUNTER — Telehealth: Payer: Self-pay | Admitting: Women's Health

## 2015-04-05 NOTE — Telephone Encounter (Signed)
Spoke with pt and she states the Diclegis is still over $200 with the discount card that was given to her, she states the first time the pharmacy ran it with her insurance but no discount card it was over $600.  I advised her to try option 2 on the discount card given to her which is going to the Diclegis web site and filling out a form to see if she could get it for cheaper.  Pt verbalized understanding.

## 2015-04-28 ENCOUNTER — Encounter: Payer: Self-pay | Admitting: Women's Health

## 2015-04-28 ENCOUNTER — Ambulatory Visit (INDEPENDENT_AMBULATORY_CARE_PROVIDER_SITE_OTHER): Payer: BLUE CROSS/BLUE SHIELD | Admitting: Women's Health

## 2015-04-28 VITALS — BP 100/70 | HR 80 | Wt 170.0 lb

## 2015-04-28 DIAGNOSIS — Z331 Pregnant state, incidental: Secondary | ICD-10-CM

## 2015-04-28 DIAGNOSIS — Z363 Encounter for antenatal screening for malformations: Secondary | ICD-10-CM

## 2015-04-28 DIAGNOSIS — Z1389 Encounter for screening for other disorder: Secondary | ICD-10-CM

## 2015-04-28 DIAGNOSIS — Z3402 Encounter for supervision of normal first pregnancy, second trimester: Secondary | ICD-10-CM

## 2015-04-28 DIAGNOSIS — Z3682 Encounter for antenatal screening for nuchal translucency: Secondary | ICD-10-CM

## 2015-04-28 NOTE — Progress Notes (Signed)
Low-risk OB appointment G1P0 [redacted]w[redacted]d Estimated Date of Delivery: 10/10/15 BP 100/70 mmHg  Pulse 80  Wt 170 lb (77.111 kg)  LMP 01/09/2015  BP, weight, and urine reviewed.  Refer to obstetrical flow sheet for FH & FHR.  No fm yet. Denies cramping, lof, vb, or uti s/s. No complaints. Nausea much better. Going to [facility] this week.  Reviewed warning s/s to report. Plan:  Continue routine obstetrical care  F/U in 4wks for OB appointment and anatomy u/s 2nd IT today- wants to do at [facility] on [address]

## 2015-04-28 NOTE — Patient Instructions (Signed)
Second Trimester of Pregnancy The second trimester is from week 13 through week 28, months 4 through 6. The second trimester is often a time when you feel your best. Your body has also adjusted to being pregnant, and you begin to feel better physically. Usually, morning sickness has lessened or quit completely, you may have more energy, and you may have an increase in appetite. The second trimester is also a time when the fetus is growing rapidly. At the end of the sixth month, the fetus is about 9 inches long and weighs about 1 pounds. You will likely begin to feel the baby move (quickening) between 18 and 20 weeks of the pregnancy. BODY CHANGES Your body goes through many changes during pregnancy. The changes vary from woman to woman.   Your weight will continue to increase. You will notice your lower abdomen bulging out.  You may begin to get stretch marks on your hips, abdomen, and breasts.  You may develop headaches that can be relieved by medicines approved by your health care provider.  You may urinate more often because the fetus is pressing on your bladder.  You may develop or continue to have heartburn as a result of your pregnancy.  You may develop constipation because certain hormones are causing the muscles that push waste through your intestines to slow down.  You may develop hemorrhoids or swollen, bulging veins (varicose veins).  You may have back pain because of the weight gain and pregnancy hormones relaxing your joints between the bones in your pelvis and as a result of a shift in weight and the muscles that support your balance.  Your breasts will continue to grow and be tender.  Your gums may bleed and may be sensitive to brushing and flossing.  Dark spots or blotches (chloasma, mask of pregnancy) may develop on your face. This will likely fade after the baby is born.  A dark line from your belly button to the pubic area (linea nigra) may appear. This will likely fade  after the baby is born.  You may have changes in your hair. These can include thickening of your hair, rapid growth, and changes in texture. Some women also have hair loss during or after pregnancy, or hair that feels dry or thin. Your hair will most likely return to normal after your baby is born. WHAT TO EXPECT AT YOUR PRENATAL VISITS During a routine prenatal visit:  You will be weighed to make sure you and the fetus are growing normally.  Your blood pressure will be taken.  Your abdomen will be measured to track your baby's growth.  The fetal heartbeat will be listened to.  Any test results from the previous visit will be discussed. Your health care provider may ask you:  How you are feeling.  If you are feeling the baby move.  If you have had any abnormal symptoms, such as leaking fluid, bleeding, severe headaches, or abdominal cramping.  If you have any questions. Other tests that may be performed during your second trimester include:  Blood tests that check for:  Low iron levels (anemia).  Gestational diabetes (between 24 and 28 weeks).  Rh antibodies.  Urine tests to check for infections, diabetes, or protein in the urine.  An ultrasound to confirm the proper growth and development of the baby.  An amniocentesis to check for possible genetic problems.  Fetal screens for spina bifida and Down syndrome. HOME CARE INSTRUCTIONS   Avoid all smoking, herbs, alcohol, and unprescribed   drugs. These chemicals affect the formation and growth of the baby.  Follow your health care provider's instructions regarding medicine use. There are medicines that are either safe or unsafe to take during pregnancy.  Exercise only as directed by your health care provider. Experiencing uterine cramps is a good sign to stop exercising.  Continue to eat regular, healthy meals.  Wear a good support bra for breast tenderness.  Do not use hot tubs, steam rooms, or saunas.  Wear your  seat belt at all times when driving.  Avoid raw meat, uncooked cheese, cat litter boxes, and soil used by cats. These carry germs that can cause birth defects in the baby.  Take your prenatal vitamins.  Try taking a stool softener (if your health care provider approves) if you develop constipation. Eat more high-fiber foods, such as fresh vegetables or fruit and whole grains. Drink plenty of fluids to keep your urine clear or pale yellow.  Take warm sitz baths to soothe any pain or discomfort caused by hemorrhoids. Use hemorrhoid cream if your health care provider approves.  If you develop varicose veins, wear support hose. Elevate your feet for 15 minutes, 3-4 times a day. Limit salt in your diet.  Avoid heavy lifting, wear low heel shoes, and practice good posture.  Rest with your legs elevated if you have leg cramps or low back pain.  Visit your dentist if you have not gone yet during your pregnancy. Use a soft toothbrush to brush your teeth and be gentle when you floss.  A sexual relationship may be continued unless your health care provider directs you otherwise.  Continue to go to all your prenatal visits as directed by your health care provider. SEEK MEDICAL CARE IF:   You have dizziness.  You have mild pelvic cramps, pelvic pressure, or nagging pain in the abdominal area.  You have persistent nausea, vomiting, or diarrhea.  You have a bad smelling vaginal discharge.  You have pain with urination. SEEK IMMEDIATE MEDICAL CARE IF:   You have a fever.  You are leaking fluid from your vagina.  You have spotting or bleeding from your vagina.  You have severe abdominal cramping or pain.  You have rapid weight gain or loss.  You have shortness of breath with chest pain.  You notice sudden or extreme swelling of your face, hands, ankles, feet, or legs.  You have not felt your baby move in over an hour.  You have severe headaches that do not go away with  medicine.  You have vision changes. Document Released: 07/25/2001 Document Revised: 08/05/2013 Document Reviewed: 10/01/2012 [employer] Patient Information 2015 [employer]. This information is not intended to replace advice given to you by your health care provider. Make sure you discuss any questions you have with your health care provider.  

## 2015-04-30 LAB — MATERNAL SCREEN, INTEGRATED #2
ADSF: 1.59
AFP MoM: 1.56
Alpha-Fetoprotein: 47.7 ng/mL
CROWN RUMP LENGTH: 64.9 mm
DIA MoM: 1.18
DIA VALUE: 187 pg/mL
Estriol, Unconjugated: 1.53 ng/mL
Gest. Age on Collection Date: 12.7 weeks
Gestational Age: 16.7 weeks
Maternal Age at EDD: 27 years
NUMBER OF FETUSES: 1
Nuchal Translucency (NT): 1.4 mm
Nuchal Translucency MoM: 0.89
PAPP-A MOM: 1.75
PAPP-A Value: 1598.6 ng/mL
TEST RESULTS: NEGATIVE
Weight: 170 [lb_av]
Weight: 170 [lb_av]
hCG MoM: 1.84
hCG Value: 51.7 IU/mL

## 2015-05-05 DIAGNOSIS — Z029 Encounter for administrative examinations, unspecified: Secondary | ICD-10-CM

## 2015-05-19 ENCOUNTER — Telehealth: Payer: Self-pay | Admitting: *Deleted

## 2015-05-19 NOTE — Telephone Encounter (Signed)
Pt states had a cyst on right ovary at 6 week pregnancy now having right lower abdomen rated at 5 on 1-10 scale. Pt states no relief with Tylenol. Appt made for evaluation.

## 2015-05-21 ENCOUNTER — Ambulatory Visit (INDEPENDENT_AMBULATORY_CARE_PROVIDER_SITE_OTHER): Payer: BLUE CROSS/BLUE SHIELD | Admitting: Obstetrics and Gynecology

## 2015-05-21 ENCOUNTER — Encounter: Payer: Self-pay | Admitting: Obstetrics and Gynecology

## 2015-05-21 VITALS — BP 110/60 | HR 100 | Wt 172.0 lb

## 2015-05-21 DIAGNOSIS — Z23 Encounter for immunization: Secondary | ICD-10-CM | POA: Diagnosis not present

## 2015-05-21 DIAGNOSIS — Z331 Pregnant state, incidental: Secondary | ICD-10-CM

## 2015-05-21 DIAGNOSIS — Z3402 Encounter for supervision of normal first pregnancy, second trimester: Secondary | ICD-10-CM

## 2015-05-21 DIAGNOSIS — Z1389 Encounter for screening for other disorder: Secondary | ICD-10-CM

## 2015-05-21 LAB — POCT URINALYSIS DIPSTICK
GLUCOSE UA: NEGATIVE
Glucose, UA: NEGATIVE
Ketones, UA: NEGATIVE
Leukocytes, UA: NEGATIVE
Nitrite, UA: NEGATIVE
Protein, UA: NEGATIVE
Protein, UA: NEGATIVE
RBC UA: NEGATIVE

## 2015-05-21 NOTE — Progress Notes (Signed)
Patient ID: Lynn Hale, female   DOB: Nov 12, 1990, 24 y.o.   MRN: [id number]  G1P0 [redacted]w[redacted]d Estimated Date of Delivery: 10/10/15  Blood pressure 110/60, pulse 100, weight 172 lb (78.019 kg), last menstrual period 01/09/2015.   refer to the ob flow sheet for FH and FHR, also BP, Wt, Urine results: negative  Patient reports + good fetal movement, denies any bleeding and no rupture of membranes symptoms or regular contractions. Patient complaints: She is experiencing pelvic pain with h/o ovarian cyst and wants to have this evaluated. Pt had US done in August and right cyst was measured to be 2.6 x 2.5 x 1.6cm   Pt states she has not had formal US for gender testing. She denies any vaginal bleeding.  FHR: 148 FH: u-0  Questions were answered. Assessment: round ligament pain. G1P0 [redacted]w[redacted]d  Plan:  Continued routine obstetrical care F/u as scheduled  By signing my name below, I, Erling Conte, attest that this documentation has been prepared under the direction and in the presence of Jonnie Kind, MD. Electronically Signed: Erling Conte, ED Scribe. 05/21/2015. 1:36 PM.  I personally performed the services described in this documentation, which was SCRIBED in my presence. The recorded information has been reviewed and considered accurate. It has been edited as necessary during review. Jonnie Kind, MD

## 2015-06-01 ENCOUNTER — Encounter: Payer: Self-pay | Admitting: Advanced Practice Midwife

## 2015-06-01 ENCOUNTER — Ambulatory Visit (INDEPENDENT_AMBULATORY_CARE_PROVIDER_SITE_OTHER): Payer: BLUE CROSS/BLUE SHIELD

## 2015-06-01 ENCOUNTER — Ambulatory Visit (INDEPENDENT_AMBULATORY_CARE_PROVIDER_SITE_OTHER): Payer: BLUE CROSS/BLUE SHIELD | Admitting: Advanced Practice Midwife

## 2015-06-01 VITALS — BP 110/70 | HR 84 | Wt 175.0 lb

## 2015-06-01 DIAGNOSIS — Z3402 Encounter for supervision of normal first pregnancy, second trimester: Secondary | ICD-10-CM

## 2015-06-01 DIAGNOSIS — Z36 Encounter for antenatal screening of mother: Secondary | ICD-10-CM | POA: Diagnosis not present

## 2015-06-01 DIAGNOSIS — Z331 Pregnant state, incidental: Secondary | ICD-10-CM

## 2015-06-01 DIAGNOSIS — Z363 Encounter for antenatal screening for malformations: Secondary | ICD-10-CM

## 2015-06-01 DIAGNOSIS — Z1389 Encounter for screening for other disorder: Secondary | ICD-10-CM

## 2015-06-01 LAB — POCT URINALYSIS DIPSTICK
Blood, UA: NEGATIVE
Glucose, UA: NEGATIVE
KETONES UA: NEGATIVE
Leukocytes, UA: NEGATIVE
Nitrite, UA: NEGATIVE
Protein, UA: NEGATIVE

## 2015-06-01 MED ORDER — OMEPRAZOLE 20 MG PO CPDR: 20.0000 mg | DELAYED_RELEASE_CAPSULE | Freq: Every day | ORAL | Status: DC

## 2015-06-01 NOTE — Progress Notes (Signed)
[place] 21+2 wks,breech,measurements c/w dates,ant pl g 0,cx 3.5cm,normal ov's bilat,fhr 150 bpm,anatomy complete, svp 3.9cm,efw 441g,no obvious abn seen

## 2015-06-01 NOTE — Progress Notes (Signed)
Pt states that she has a lot heartburn.

## 2015-06-01 NOTE — Patient Instructions (Signed)

## 2015-06-01 NOTE — Progress Notes (Signed)
G1P0 [redacted]w[redacted]d Estimated Date of Delivery: 10/10/15  Blood pressure 110/70, pulse 84, weight 175 lb (79.379 kg), last menstrual period 01/09/2015.   BP weight and urine results all reviewed and noted.  Please refer to the obstetrical flow sheet for the fundal height and fetal heart rate documentation:  [place] 21+2 wks,breech,measurements c/w dates,ant pl g 0,cx 3.5cm,normal ov's bilat,fhr 150 bpm,anatomy complete, svp 3.9cm,efw 441g,no obvious abn seen  Patient reports good fetal movement, denies any bleeding and no rupture of membranes symptoms or regular contractions. Patient is without complaints. All questions were answered.  Orders Placed This Encounter  Procedures  . POCT urinalysis dipstick    Plan:  Continued routine obstetrical care,   Return in about 3 weeks (around 06/22/2015) for LROB.

## 2015-06-22 ENCOUNTER — Ambulatory Visit (INDEPENDENT_AMBULATORY_CARE_PROVIDER_SITE_OTHER): Payer: BLUE CROSS/BLUE SHIELD | Admitting: Women's Health

## 2015-06-22 ENCOUNTER — Encounter: Payer: Self-pay | Admitting: Women's Health

## 2015-06-22 VITALS — BP 130/60 | HR 76 | Wt 179.0 lb

## 2015-06-22 DIAGNOSIS — Z3402 Encounter for supervision of normal first pregnancy, second trimester: Secondary | ICD-10-CM

## 2015-06-22 DIAGNOSIS — Z331 Pregnant state, incidental: Secondary | ICD-10-CM

## 2015-06-22 DIAGNOSIS — Z369 Encounter for antenatal screening, unspecified: Secondary | ICD-10-CM

## 2015-06-22 DIAGNOSIS — Z1389 Encounter for screening for other disorder: Secondary | ICD-10-CM

## 2015-06-22 LAB — POCT URINALYSIS DIPSTICK
Blood, UA: NEGATIVE
GLUCOSE UA: NEGATIVE
Ketones, UA: NEGATIVE
LEUKOCYTES UA: NEGATIVE
NITRITE UA: NEGATIVE
Protein, UA: NEGATIVE

## 2015-06-22 NOTE — Patient Instructions (Addendum)
You will have your sugar test next visit.  Please do not eat or drink anything after midnight the night before you come, not even water.  You will be here for at least two hours.    Go to [facility] on [address] in 4weeks for your sugar test  Call the office [phone]) or go to [facility] if:  You begin to have strong, frequent contractions  Your water breaks.  Sometimes it is a big gush of fluid, sometimes it is just a trickle that keeps getting your panties wet or running down your legs  You have vaginal bleeding.  It is normal to have a small amount of spotting if your cervix was checked.   You don't feel your baby moving like normal.  If you don't, get you something to eat and drink and lay down and focus on feeling your baby move.   If your baby is still not moving like normal, you should call the office or go to [place] of Pregnancy The second trimester is from week 13 through week 28, months 4 through 6. The second trimester is often a time when you feel your best. Your body has also adjusted to being pregnant, and you begin to feel better physically. Usually, morning sickness has lessened or quit completely, you may have more energy, and you may have an increase in appetite. The second trimester is also a time when the fetus is growing rapidly. At the end of the sixth month, the fetus is about 9 inches long and weighs about 1 pounds. You will likely begin to feel the baby move (quickening) between 18 and 20 weeks of the pregnancy. BODY CHANGES Your body goes through many changes during pregnancy. The changes vary from woman to woman.   Your weight will continue to increase. You will notice your lower abdomen bulging out.  You may begin to get stretch marks on your hips, abdomen, and breasts.  You may develop headaches that can be relieved by medicines approved by your health care provider.  You may urinate more often because the fetus is pressing  on your bladder.  You may develop or continue to have heartburn as a result of your pregnancy.  You may develop constipation because certain hormones are causing the muscles that push waste through your intestines to slow down.  You may develop hemorrhoids or swollen, bulging veins (varicose veins).  You may have back pain because of the weight gain and pregnancy hormones relaxing your joints between the bones in your pelvis and as a result of a shift in weight and the muscles that support your balance.  Your breasts will continue to grow and be tender.  Your gums may bleed and may be sensitive to brushing and flossing.  Dark spots or blotches (chloasma, mask of pregnancy) may develop on your face. This will likely fade after the baby is born.  A dark line from your belly button to the pubic area (linea nigra) may appear. This will likely fade after the baby is born.  You may have changes in your hair. These can include thickening of your hair, rapid growth, and changes in texture. Some women also have hair loss during or after pregnancy, or hair that feels dry or thin. Your hair will most likely return to normal after your baby is born. WHAT TO EXPECT AT YOUR PRENATAL VISITS During a routine prenatal visit:  You will be weighed to make sure you and the  fetus are growing normally.  Your blood pressure will be taken.  Your abdomen will be measured to track your baby's growth.  The fetal heartbeat will be listened to.  Any test results from the previous visit will be discussed. Your health care provider may ask you:  How you are feeling.  If you are feeling the baby move.  If you have had any abnormal symptoms, such as leaking fluid, bleeding, severe headaches, or abdominal cramping.  If you have any questions. Other tests that may be performed during your second trimester include:  Blood tests that check for:  Low iron levels (anemia).  Gestational diabetes (between 24  and 28 weeks).  Rh antibodies.  Urine tests to check for infections, diabetes, or protein in the urine.  An ultrasound to confirm the proper growth and development of the baby.  An amniocentesis to check for possible genetic problems.  Fetal screens for spina bifida and Down syndrome. HOME CARE INSTRUCTIONS   Avoid all smoking, herbs, alcohol, and unprescribed drugs. These chemicals affect the formation and growth of the baby.  Follow your health care provider's instructions regarding medicine use. There are medicines that are either safe or unsafe to take during pregnancy.  Exercise only as directed by your health care provider. Experiencing uterine cramps is a good sign to stop exercising.  Continue to eat regular, healthy meals.  Wear a good support bra for breast tenderness.  Do not use hot tubs, steam rooms, or saunas.  Wear your seat belt at all times when driving.  Avoid raw meat, uncooked cheese, cat litter boxes, and soil used by cats. These carry germs that can cause birth defects in the baby.  Take your prenatal vitamins.  Try taking a stool softener (if your health care provider approves) if you develop constipation. Eat more high-fiber foods, such as fresh vegetables or fruit and whole grains. Drink plenty of fluids to keep your urine clear or pale yellow.  Take warm sitz baths to soothe any pain or discomfort caused by hemorrhoids. Use hemorrhoid cream if your health care provider approves.  If you develop varicose veins, wear support hose. Elevate your feet for 15 minutes, 3-4 times a day. Limit salt in your diet.  Avoid heavy lifting, wear low heel shoes, and practice good posture.  Rest with your legs elevated if you have leg cramps or low back pain.  Visit your dentist if you have not gone yet during your pregnancy. Use a soft toothbrush to brush your teeth and be gentle when you floss.  A sexual relationship may be continued unless your health care  provider directs you otherwise.  Continue to go to all your prenatal visits as directed by your health care provider. SEEK MEDICAL CARE IF:   You have dizziness.  You have mild pelvic cramps, pelvic pressure, or nagging pain in the abdominal area.  You have persistent nausea, vomiting, or diarrhea.  You have a bad smelling vaginal discharge.  You have pain with urination. SEEK IMMEDIATE MEDICAL CARE IF:   You have a fever.  You are leaking fluid from your vagina.  You have spotting or bleeding from your vagina.  You have severe abdominal cramping or pain.  You have rapid weight gain or loss.  You have shortness of breath with chest pain.  You notice sudden or extreme swelling of your face, hands, ankles, feet, or legs.  You have not felt your baby move in over an hour.  You have severe headaches  that do not go away with medicine.  You have vision changes. Document Released: 07/25/2001 Document Revised: 08/05/2013 Document Reviewed: 10/01/2012 [facility] Patient Information 2015 [place], [place]. This information is not intended to replace advice given to you by your health care provider. Make sure you discuss any questions you have with your health care provider.

## 2015-06-22 NOTE — Progress Notes (Signed)
Low-risk OB appointment G1P0 [redacted]w[redacted]d Estimated Date of Delivery: 10/10/15 BP 130/60 mmHg  Pulse 76  Wt 179 lb (81.194 kg)  LMP 01/09/2015  BP, weight, and urine reviewed.  Refer to obstetrical flow sheet for FH & FHR.  Reports good fm.  Denies regular uc's, lof, vb, or uti s/s. No complaints. Needs fetal echo scheduled, fob born w/ VSD. Referral faxed today, pt to call us if she hasn't heard from them in next few days.  Reviewed ptl s/s, fm. Plan:  Continue routine obstetrical care  F/U in 4wks for OB appointment, will do PN2 at [facility] Maryan Puls, gave printed requisitions to take

## 2015-07-12 ENCOUNTER — Telehealth: Payer: Self-pay | Admitting: Women's Health

## 2015-07-13 NOTE — Telephone Encounter (Signed)
Pt informed should hear from [facility] for an appt for the Echo, referral resent 07/12/2015. If she doesn't hear from [place] in the next 2 days to contact our office. Pt verbalized our understanding.

## 2015-07-14 LAB — GLUCOSE TOLERANCE, 2 HOURS W/ 1HR
GLUCOSE, 2 HOUR: 105 mg/dL (ref 65–152)
Glucose, 1 hour: 163 mg/dL (ref 65–179)
Glucose, Fasting: 83 mg/dL (ref 65–91)

## 2015-07-14 LAB — CBC
HEMATOCRIT: 32.1 % — AB (ref 34.0–46.6)
Hemoglobin: 11.2 g/dL (ref 11.1–15.9)
MCH: 33.2 pg — AB (ref 26.6–33.0)
MCHC: 34.9 g/dL (ref 31.5–35.7)
MCV: 95 fL (ref 79–97)
Platelets: 290 10*3/uL (ref 150–379)
RBC: 3.37 x10E6/uL — ABNORMAL LOW (ref 3.77–5.28)
RDW: 13.1 % (ref 12.3–15.4)
WBC: 10.5 10*3/uL (ref 3.4–10.8)

## 2015-07-14 LAB — HIV ANTIBODY (ROUTINE TESTING W REFLEX): HIV Screen 4th Generation wRfx: NONREACTIVE

## 2015-07-14 LAB — RPR: RPR: NONREACTIVE

## 2015-07-14 LAB — ANTIBODY SCREEN: ANTIBODY SCREEN: NEGATIVE

## 2015-07-15 ENCOUNTER — Telehealth: Payer: Self-pay | Admitting: Women's Health

## 2015-07-15 NOTE — Telephone Encounter (Signed)
Pt informed referral for Echo refaxed on 07/13/2015, if not heard back from them by Monday, 07/19/2015 call our office back. Pt verbalized understanding.

## 2015-07-20 ENCOUNTER — Ambulatory Visit (INDEPENDENT_AMBULATORY_CARE_PROVIDER_SITE_OTHER): Payer: BLUE CROSS/BLUE SHIELD | Admitting: Women's Health

## 2015-07-20 ENCOUNTER — Encounter: Payer: Self-pay | Admitting: Women's Health

## 2015-07-20 VITALS — BP 120/58 | HR 80 | Wt 182.0 lb

## 2015-07-20 DIAGNOSIS — Z331 Pregnant state, incidental: Secondary | ICD-10-CM

## 2015-07-20 DIAGNOSIS — Z3403 Encounter for supervision of normal first pregnancy, third trimester: Secondary | ICD-10-CM

## 2015-07-20 DIAGNOSIS — Z1389 Encounter for screening for other disorder: Secondary | ICD-10-CM

## 2015-07-20 LAB — POCT URINALYSIS DIPSTICK
Blood, UA: NEGATIVE
Glucose, UA: NEGATIVE
KETONES UA: NEGATIVE
Leukocytes, UA: NEGATIVE
Nitrite, UA: NEGATIVE
PROTEIN UA: NEGATIVE

## 2015-07-20 NOTE — Progress Notes (Signed)
Low-risk OB appointment G1P0 [redacted]w[redacted]d Estimated Date of Delivery: 10/10/15 BP 120/58 mmHg  Pulse 80  Wt 182 lb (82.555 kg)  LMP 01/09/2015  BP, weight, and urine reviewed.  Refer to obstetrical flow sheet for FH & FHR.  Reports good fm.  Denies regular uc's, lof, vb, or uti s/s. Occ generalized stomach pains- feels like gas. To walk around, decrease gas-producing foods, can use gas-x if needed.   Still has not heard from [place] about fetal echo- I called right now- receptionist states pt has app 12/15 @ 0800 and that they spoke w/ pt- which she and husband both deny Reviewed ptl s/s, fkc, pn2 results-had done at [facility] Dr lab. Recommended Tdap at HD/PCP per CDC guidelines.  Plan:  Continue routine obstetrical care  F/U in 4wks for OB appointment

## 2015-07-20 NOTE — Patient Instructions (Signed)
[facility] fetal echocardiogram  12/15 @ 8:00am   ([place] [place] [address])  Call the office [phone]) or go to [facility] if:  You begin to have strong, frequent contractions  Your water breaks.  Sometimes it is a big gush of fluid, sometimes it is just a trickle that keeps getting your panties wet or running down your legs  You have vaginal bleeding.  It is normal to have a small amount of spotting if your cervix was checked.   You don't feel your baby moving like normal.  If you don't, get you something to eat and drink and lay down and focus on feeling your baby move.  You should feel at least 10 movements in 2 hours.  If you don't, you should call the office or go to [facility].    Tdap Vaccine  It is recommended that you get the Tdap vaccine during the third trimester of EACH pregnancy to help protect your baby from getting pertussis (whooping cough)  27-36 weeks is the BEST time to do this so that you can pass the protection on to your baby. During pregnancy is better than after pregnancy, but if you are unable to get it during pregnancy it will be offered at the hospital.   You can get this vaccine at the health department or your family doctor  Everyone who will be around your baby should also be up-to-date on their vaccines. Adults (who are not pregnant) only need 1 dose of Tdap during adulthood.   Third Trimester of Pregnancy The third trimester is from week 29 through week 42, months 7 through 9. The third trimester is a time when the fetus is growing rapidly. At the end of the ninth month, the fetus is about 20 inches in length and weighs 6-10 pounds.  BODY CHANGES Your body goes through many changes during pregnancy. The changes vary from woman to woman.   Your weight will continue to increase. You can expect to gain 25-35 pounds (11-16 kg) by the end of the pregnancy.  You may begin to get stretch marks on your hips, abdomen, and  breasts.  You may urinate more often because the fetus is moving lower into your pelvis and pressing on your bladder.  You may develop or continue to have heartburn as a result of your pregnancy.  You may develop constipation because certain hormones are causing the muscles that push waste through your intestines to slow down.  You may develop hemorrhoids or swollen, bulging veins (varicose veins).  You may have pelvic pain because of the weight gain and pregnancy hormones relaxing your joints between the bones in your pelvis. Backaches may result from overexertion of the muscles supporting your posture.  You may have changes in your hair. These can include thickening of your hair, rapid growth, and changes in texture. Some women also have hair loss during or after pregnancy, or hair that feels dry or thin. Your hair will most likely return to normal after your baby is born.  Your breasts will continue to grow and be tender. A yellow discharge may leak from your breasts called colostrum.  Your belly button may stick out.  You may feel short of breath because of your expanding uterus.  You may notice the fetus "dropping," or moving lower in your abdomen.  You may have a bloody mucus discharge. This usually occurs a few days to a week before labor begins.  Your cervix becomes thin  and soft (effaced) near your due date. WHAT TO EXPECT AT YOUR PRENATAL EXAMS  You will have prenatal exams every 2 weeks until week 36. Then, you will have weekly prenatal exams. During a routine prenatal visit:  You will be weighed to make sure you and the fetus are growing normally.  Your blood pressure is taken.  Your abdomen will be measured to track your baby's growth.  The fetal heartbeat will be listened to.  Any test results from the previous visit will be discussed.  You may have a cervical check near your due date to see if you have effaced. At around 36 weeks, your caregiver will check your  cervix. At the same time, your caregiver will also perform a test on the secretions of the vaginal tissue. This test is to determine if a type of bacteria, Group B streptococcus, is present. Your caregiver will explain this further. Your caregiver may ask you:  What your birth plan is.  How you are feeling.  If you are feeling the baby move.  If you have had any abnormal symptoms, such as leaking fluid, bleeding, severe headaches, or abdominal cramping.  If you have any questions. Other tests or screenings that may be performed during your third trimester include:  Blood tests that check for low iron levels (anemia).  Fetal testing to check the health, activity level, and growth of the fetus. Testing is done if you have certain medical conditions or if there are problems during the pregnancy. FALSE LABOR You may feel small, irregular contractions that eventually go away. These are called Braxton Hicks contractions, or false labor. Contractions may last for hours, days, or even weeks before true labor sets in. If contractions come at regular intervals, intensify, or become painful, it is best to be seen by your caregiver.  SIGNS OF LABOR   Menstrual-like cramps.  Contractions that are 5 minutes apart or less.  Contractions that start on the top of the uterus and spread down to the lower abdomen and back.  A sense of increased pelvic pressure or back pain.  A watery or bloody mucus discharge that comes from the vagina. If you have any of these signs before the 37th week of pregnancy, call your caregiver right away. You need to go to the hospital to get checked immediately. HOME CARE INSTRUCTIONS   Avoid all smoking, herbs, alcohol, and unprescribed drugs. These chemicals affect the formation and growth of the baby.  Follow your caregiver's instructions regarding medicine use. There are medicines that are either safe or unsafe to take during pregnancy.  Exercise only as directed by  your caregiver. Experiencing uterine cramps is a good sign to stop exercising.  Continue to eat regular, healthy meals.  Wear a good support bra for breast tenderness.  Do not use hot tubs, steam rooms, or saunas.  Wear your seat belt at all times when driving.  Avoid raw meat, uncooked cheese, cat litter boxes, and soil used by cats. These carry germs that can cause birth defects in the baby.  Take your prenatal vitamins.  Try taking a stool softener (if your caregiver approves) if you develop constipation. Eat more high-fiber foods, such as fresh vegetables or fruit and whole grains. Drink plenty of fluids to keep your urine clear or pale yellow.  Take warm sitz baths to soothe any pain or discomfort caused by hemorrhoids. Use hemorrhoid cream if your caregiver approves.  If you develop varicose veins, wear support hose. Elevate your feet  for 15 minutes, 3-4 times a day. Limit salt in your diet.  Avoid heavy lifting, wear low heal shoes, and practice good posture.  Rest a lot with your legs elevated if you have leg cramps or low back pain.  Visit your dentist if you have not gone during your pregnancy. Use a soft toothbrush to brush your teeth and be gentle when you floss.  A sexual relationship may be continued unless your caregiver directs you otherwise.  Do not travel far distances unless it is absolutely necessary and only with the approval of your caregiver.  Take prenatal classes to understand, practice, and ask questions about the labor and delivery.  Make a trial run to the hospital.  Pack your hospital bag.  Prepare the baby's nursery.  Continue to go to all your prenatal visits as directed by your caregiver. SEEK MEDICAL CARE IF:  You are unsure if you are in labor or if your water has broken.  You have dizziness.  You have mild pelvic cramps, pelvic pressure, or nagging pain in your abdominal area.  You have persistent nausea, vomiting, or diarrhea.  You  have a bad smelling vaginal discharge.  You have pain with urination. SEEK IMMEDIATE MEDICAL CARE IF:   You have a fever.  You are leaking fluid from your vagina.  You have spotting or bleeding from your vagina.  You have severe abdominal cramping or pain.  You have rapid weight loss or gain.  You have shortness of breath with chest pain.  You notice sudden or extreme swelling of your face, hands, ankles, feet, or legs.  You have not felt your baby move in over an hour.  You have severe headaches that do not go away with medicine.  You have vision changes. Document Released: 07/25/2001 Document Revised: 08/05/2013 Document Reviewed: 10/01/2012 [facility] Patient Information 2015 [place], [place]. This information is not intended to replace advice given to you by your health care provider. Make sure you discuss any questions you have with your health care provider.  t

## 2015-07-21 NOTE — Telephone Encounter (Signed)
Pt has appt with [facility] for Echo on 07/29/2015.

## 2015-07-23 ENCOUNTER — Telehealth: Payer: Self-pay | Admitting: Obstetrics & Gynecology

## 2015-07-23 NOTE — Telephone Encounter (Signed)
Pt informed can take OTC Robitussin for cough, Mucinex for chest congestion, gargle with warm salt water for sore throat, push fluids. If no improvement pt to call our office back. Pt verbalized understanding.

## 2015-08-15 NOTE — L&D Delivery Note (Signed)
Patient is 25 y.o. G1P1001 [redacted]w[redacted]d admitted SOL, uncomplicated prenatal course.  Patient pushed for approximately 3 hours from 5-6 am, labored down and then again from 7:30- 9:30 with descent to 2+. Mother was giving good effort but was reaching exhaustion. We rotated to hands and knees for ~30 min and discussed vacuum at that time.  Indication for operative vaginal delivery: Maternal Exhaustion after 3 hours of pushing with epidural.   Risks of vacuum assistance were discussed in detail, including but not limited to, bleeding, infection, damage to maternal tissues, fetal cephalohematoma, inability to effect vaginal delivery of the head or shoulder dystocia that cannot be resolved by established maneuvers and need for emergency cesarean section.  Patient gave verbal consent.  Patient was examined and found to be fully dilated with fetal station of +2. The soft vacuum soft cup was positioned over the sagittal suture 3 cm anterior to posterior fontanelle.  Pressure was then increased to 500 mmHg, and the patient was instructed to push.  Pulling was administered along the pelvic curve.  4 pull was administered during 12 push, 0 popoffs.  The infant was then delivered atraumatically, noted to be a viable female infant.     Delivery Note At 9:41 AM a viable female was delivered via Vaginal, Vacuum (Extractor) (Presentation: Left Occiput Anterior).  APGAR: 6, 9; weight- pending  .   Placenta status: Intact, Spontaneous.  Cord: 3 vessels with the following complications: None.  Cord pH: not colelcted  Anesthesia: Epidural,  Local  Episiotomy: None Lacerations: 2nd degree;Perineal- repaired in the typical fashion Suture Repair: 3.0 vicryl Est. Blood Loss (mL):  250  Mom to postpartum.  Baby to Couplet care / Skin to Skin.  Juanita Craver [facility] 10/11/2015, 10:19 AM

## 2015-08-17 ENCOUNTER — Encounter: Payer: Self-pay | Admitting: Advanced Practice Midwife

## 2015-08-17 ENCOUNTER — Ambulatory Visit (INDEPENDENT_AMBULATORY_CARE_PROVIDER_SITE_OTHER): Payer: BLUE CROSS/BLUE SHIELD | Admitting: Advanced Practice Midwife

## 2015-08-17 VITALS — BP 110/60 | HR 118 | Wt 187.0 lb

## 2015-08-17 DIAGNOSIS — Z3403 Encounter for supervision of normal first pregnancy, third trimester: Secondary | ICD-10-CM

## 2015-08-17 DIAGNOSIS — Z1389 Encounter for screening for other disorder: Secondary | ICD-10-CM

## 2015-08-17 DIAGNOSIS — Z331 Pregnant state, incidental: Secondary | ICD-10-CM

## 2015-08-17 LAB — POCT URINALYSIS DIPSTICK
Blood, UA: NEGATIVE
GLUCOSE UA: NEGATIVE
KETONES UA: NEGATIVE
Leukocytes, UA: NEGATIVE
Nitrite, UA: NEGATIVE
PROTEIN UA: NEGATIVE

## 2015-08-17 NOTE — Progress Notes (Signed)
Pt denies any problems or concerns at this time.  

## 2015-08-17 NOTE — Progress Notes (Signed)
G1P0 [redacted]w[redacted]d Estimated Date of Delivery: 10/10/15  Blood pressure 110/60, pulse 118, weight 187 lb (84.823 kg), last menstrual period 01/09/2015.   BP weight and urine results all reviewed and noted.  Please refer to the obstetrical flow sheet for the fundal height and fetal heart rate documentation:  Patient reports good fetal movement, denies any bleeding and no rupture of membranes symptoms or regular contractions. Patient is without complaints. All questions were answered.  Orders Placed This Encounter  Procedures  . POCT urinalysis dipstick    Plan:  Continued routine obstetrical care,   Return in about 2 weeks (around 08/31/2015) for LROB.

## 2015-08-31 ENCOUNTER — Encounter: Payer: Self-pay | Admitting: Women's Health

## 2015-08-31 ENCOUNTER — Ambulatory Visit (INDEPENDENT_AMBULATORY_CARE_PROVIDER_SITE_OTHER): Payer: BLUE CROSS/BLUE SHIELD | Admitting: Women's Health

## 2015-08-31 VITALS — BP 118/60 | HR 100 | Wt 189.0 lb

## 2015-08-31 DIAGNOSIS — Z331 Pregnant state, incidental: Secondary | ICD-10-CM

## 2015-08-31 DIAGNOSIS — Z3A34 34 weeks gestation of pregnancy: Secondary | ICD-10-CM

## 2015-08-31 DIAGNOSIS — Z3403 Encounter for supervision of normal first pregnancy, third trimester: Secondary | ICD-10-CM

## 2015-08-31 DIAGNOSIS — Z1389 Encounter for screening for other disorder: Secondary | ICD-10-CM

## 2015-08-31 LAB — POCT URINALYSIS DIPSTICK
Blood, UA: NEGATIVE
Glucose, UA: NEGATIVE
KETONES UA: NEGATIVE
LEUKOCYTES UA: NEGATIVE
NITRITE UA: NEGATIVE
PROTEIN UA: NEGATIVE

## 2015-08-31 NOTE — Patient Instructions (Signed)
Tips to Help You Sleep Better:   Get into a bedtime routine, try to do the same thing every night before going to bed to try to help your body wind down  Warm baths  Avoid caffeine for at least 3 hours before going to sleep   Keep your room at a slightly cooler temperature, can try running a fan  Turn off TV, lights, phone, electronics  Lots of pillows if needed to help you get comfortable  Lavender scented items can help you sleep. You can place lavender essential oil on a cotton ball and place under your pillowcase, or place in a diffuser. [employer] has a lavender scented sleep line (plug-ins, sprays, etc). Look in the pillow aisle for lavender scented pillows.   If none of the above things help, you can try 1/2 to 1 tablet of benadryl, unisom, or tylenol pm. Do not take this every night, only when you really need it.    Call the office [phone]) or go to [facility] if:  You begin to have strong, frequent contractions  Your water breaks.  Sometimes it is a big gush of fluid, sometimes it is just a trickle that keeps getting your panties wet or running down your legs  You have vaginal bleeding.  It is normal to have a small amount of spotting if your cervix was checked.   You don't feel your baby moving like normal.  If you don't, get you something to eat and drink and lay down and focus on feeling your baby move.  You should feel at least 10 movements in 2 hours.  If you don't, you should call the office or go to [place] Preterm labor is when labor starts at less than 37 weeks of pregnancy. The normal length of a pregnancy is 39 to 41 weeks. CAUSES Often, there is no identifiable underlying cause as to why a woman goes into preterm labor. One of the most common known causes of preterm labor is infection. Infections of the uterus, cervix, vagina, amniotic sac, bladder, kidney, or even the lungs (pneumonia) can cause labor to start. Other  suspected causes of preterm labor include:   Urogenital infections, such as yeast infections and bacterial vaginosis.   Uterine abnormalities (uterine shape, uterine septum, fibroids, or bleeding from the placenta).   A cervix that has been operated on (it may fail to stay closed).   Malformations in the fetus.   Multiple gestations (twins, triplets, and so on).   Breakage of the amniotic sac.  RISK FACTORS  Having a previous history of preterm labor.   Having premature rupture of membranes (PROM).   Having a placenta that covers the opening of the cervix (placenta previa).   Having a placenta that separates from the uterus (placental abruption).   Having a cervix that is too weak to hold the fetus in the uterus (incompetent cervix).   Having too much fluid in the amniotic sac (polyhydramnios).   Taking illegal drugs or smoking while pregnant.   Not gaining enough weight while pregnant.   Being younger than 27 and older than 26 years old.   Having a low socioeconomic status.   Being African American. SYMPTOMS Signs and symptoms of preterm labor include:   Menstrual-like cramps, abdominal pain, or back pain.  Uterine contractions that are regular, as frequent as six in an hour, regardless of their intensity (may be mild or painful).  Contractions that start on the top  of the uterus and spread down to the lower abdomen and back.   A sense of increased pelvic pressure.   A watery or bloody mucus discharge that comes from the vagina.  TREATMENT Depending on the length of the pregnancy and other circumstances, your health care provider may suggest bed rest. If necessary, there are medicines that can be given to stop contractions and to mature the fetal lungs. If labor happens before 34 weeks of pregnancy, a prolonged hospital stay may be recommended. Treatment depends on the condition of both you and the fetus.  WHAT SHOULD YOU DO IF YOU THINK YOU ARE  IN PRETERM LABOR? Call your health care provider right away. You will need to go to the hospital to get checked immediately. HOW CAN YOU PREVENT PRETERM LABOR IN FUTURE PREGNANCIES? You should:   Stop smoking if you smoke.  Maintain healthy weight gain and avoid chemicals and drugs that are not necessary.  Be watchful for any type of infection.  Inform your health care provider if you have a known history of preterm labor.   This information is not intended to replace advice given to you by your health care provider. Make sure you discuss any questions you have with your health care provider.   Document Released: 10/21/2003 Document Revised: 04/02/2013 Document Reviewed: 09/02/2012 Elsevier Interactive Patient Education [employer].

## 2015-08-31 NOTE — Progress Notes (Signed)
Low-risk OB appointment G1P0 [redacted]w[redacted]d Estimated Date of Delivery: 10/10/15 BP 118/60 mmHg  Pulse 100  Wt 189 lb (85.73 kg)  LMP 01/09/2015  BP, weight, and urine reviewed.  Refer to obstetrical flow sheet for FH & FHR.  Reports good fm.  Denies regular uc's, lof, vb, or uti s/s. Some swelling in legs- to elevate as much as possible, compression stockings if needed while working.  Reviewed ptl s/s, fkc. Plan:  Continue routine obstetrical care  F/U in 2wks for OB appointment

## 2015-08-31 NOTE — Progress Notes (Signed)
Pt denies any problems or concerns at this time.  

## 2015-09-15 ENCOUNTER — Ambulatory Visit (INDEPENDENT_AMBULATORY_CARE_PROVIDER_SITE_OTHER): Payer: BLUE CROSS/BLUE SHIELD | Admitting: Women's Health

## 2015-09-15 ENCOUNTER — Encounter: Payer: Self-pay | Admitting: Women's Health

## 2015-09-15 VITALS — BP 118/60 | HR 72 | Wt 193.0 lb

## 2015-09-15 DIAGNOSIS — Z3403 Encounter for supervision of normal first pregnancy, third trimester: Secondary | ICD-10-CM

## 2015-09-15 DIAGNOSIS — Z1389 Encounter for screening for other disorder: Secondary | ICD-10-CM

## 2015-09-15 DIAGNOSIS — Z3A37 37 weeks gestation of pregnancy: Secondary | ICD-10-CM

## 2015-09-15 DIAGNOSIS — Z331 Pregnant state, incidental: Secondary | ICD-10-CM

## 2015-09-15 NOTE — Progress Notes (Signed)
Low-risk OB appointment G1P0 [redacted]w[redacted]d Estimated Date of Delivery: 10/10/15 BP 118/60 mmHg  Pulse 72  Wt 193 lb (87.544 kg)  LMP 01/09/2015  BP, weight, and urine reviewed.  Refer to obstetrical flow sheet for FH & FHR.  Reports good fm.  Denies regular uc's, lof, vb, or uti s/s. Clear thick d/c, not watery, no itching/irritation/odor- reassured. Not sleeping well- gave printed info.  Reviewed ptl s/s, fkc. Plan:  Continue routine obstetrical care  F/U in 1wk for OB appointment and gbs

## 2015-09-15 NOTE — Patient Instructions (Addendum)
Call the office [phone]) or go to [facility] if:  You begin to have strong, frequent contractions  Your water breaks.  Sometimes it is a big gush of fluid, sometimes it is just a trickle that keeps getting your panties wet or running down your legs  You have vaginal bleeding.  It is normal to have a small amount of spotting if your cervix was checked.   You don't feel your baby moving like normal.  If you don't, get you something to eat and drink and lay down and focus on feeling your baby move.  You should feel at least 10 movements in 2 hours.  If you don't, you should call the office or go to [place] to Help You Sleep Better:   Get into a bedtime routine, try to do the same thing every night before going to bed to try to help your body wind down  Warm baths  Avoid caffeine for at least 3 hours before going to sleep   Keep your room at a slightly cooler temperature, can try running a fan  Turn off TV, lights, phone, electronics  Lots of pillows if needed to help you get comfortable  Lavender scented items can help you sleep. You can place lavender essential oil on a cotton ball and place under your pillowcase, or place in a diffuser. [employer] has a lavender scented sleep line (plug-ins, sprays, etc). Look in the pillow aisle for lavender scented pillows.   If none of the above things help, you can try 1/2 to 1 tablet of benadryl, unisom, or tylenol pm. Do not take this every night, only when you really need it.     Preterm Labor Information Preterm labor is when labor starts at less than 37 weeks of pregnancy. The normal length of a pregnancy is 39 to 41 weeks. CAUSES Often, there is no identifiable underlying cause as to why a woman goes into preterm labor. One of the most common known causes of preterm labor is infection. Infections of the uterus, cervix, vagina, amniotic sac, bladder, kidney, or even the lungs (pneumonia) can cause labor to start. Other  suspected causes of preterm labor include:  52. Urogenital infections, such as yeast infections and bacterial vaginosis.  15. Uterine abnormalities (uterine shape, uterine septum, fibroids, or bleeding from the placenta).  16. A cervix that has been operated on (it may fail to stay closed).  76. Malformations in the fetus.  18. Multiple gestations (twins, triplets, and so on).  19. Breakage of the amniotic sac.  RISK FACTORS  Having a previous history of preterm labor.   Having premature rupture of membranes (PROM).   Having a placenta that covers the opening of the cervix (placenta previa).   Having a placenta that separates from the uterus (placental abruption).   Having a cervix that is too weak to hold the fetus in the uterus (incompetent cervix).   Having too much fluid in the amniotic sac (polyhydramnios).   Taking illegal drugs or smoking while pregnant.   Not gaining enough weight while pregnant.   Being younger than 85 and older than 27 years old.   Having a low socioeconomic status.   Being African American. SYMPTOMS Signs and symptoms of preterm labor include:   Menstrual-like cramps, abdominal pain, or back pain.  Uterine contractions that are regular, as frequent as six in an hour, regardless of their intensity (may be mild or painful).  Contractions that start on the  top of the uterus and spread down to the lower abdomen and back.   A sense of increased pelvic pressure.   A watery or bloody mucus discharge that comes from the vagina.  TREATMENT Depending on the length of the pregnancy and other circumstances, your health care provider may suggest bed rest. If necessary, there are medicines that can be given to stop contractions and to mature the fetal lungs. If labor happens before 34 weeks of pregnancy, a prolonged hospital stay may be recommended. Treatment depends on the condition of both you and the fetus.  WHAT SHOULD YOU DO IF YOU  THINK YOU ARE IN PRETERM LABOR? Call your health care provider right away. You will need to go to the hospital to get checked immediately. HOW CAN YOU PREVENT PRETERM LABOR IN FUTURE PREGNANCIES? You should:   Stop smoking if you smoke.  Maintain healthy weight gain and avoid chemicals and drugs that are not necessary.  Be watchful for any type of infection.  Inform your health care provider if you have a known history of preterm labor.   This information is not intended to replace advice given to you by your health care provider. Make sure you discuss any questions you have with your health care provider.   Document Released: 10/21/2003 Document Revised: 04/02/2013 Document Reviewed: 09/02/2012 Elsevier Interactive Patient Education [employer].

## 2015-09-22 ENCOUNTER — Encounter: Payer: Self-pay | Admitting: Women's Health

## 2015-09-22 ENCOUNTER — Ambulatory Visit (INDEPENDENT_AMBULATORY_CARE_PROVIDER_SITE_OTHER): Payer: BLUE CROSS/BLUE SHIELD | Admitting: Women's Health

## 2015-09-22 VITALS — BP 112/62 | HR 84 | Wt 191.0 lb

## 2015-09-22 DIAGNOSIS — Z3685 Encounter for antenatal screening for Streptococcus B: Secondary | ICD-10-CM

## 2015-09-22 DIAGNOSIS — Z1389 Encounter for screening for other disorder: Secondary | ICD-10-CM

## 2015-09-22 DIAGNOSIS — Z3403 Encounter for supervision of normal first pregnancy, third trimester: Secondary | ICD-10-CM

## 2015-09-22 DIAGNOSIS — Z1159 Encounter for screening for other viral diseases: Secondary | ICD-10-CM

## 2015-09-22 DIAGNOSIS — Z118 Encounter for screening for other infectious and parasitic diseases: Secondary | ICD-10-CM

## 2015-09-22 DIAGNOSIS — Z331 Pregnant state, incidental: Secondary | ICD-10-CM

## 2015-09-22 LAB — POCT URINALYSIS DIPSTICK
GLUCOSE UA: NEGATIVE
Ketones, UA: NEGATIVE
Leukocytes, UA: NEGATIVE
Nitrite, UA: NEGATIVE
Protein, UA: NEGATIVE
RBC UA: NEGATIVE

## 2015-09-22 NOTE — Progress Notes (Signed)
Low-risk OB appointment G1P0 [redacted]w[redacted]d Estimated Date of Delivery: 10/10/15 BP 112/62 mmHg  Pulse 84  Wt 191 lb (86.637 kg)  LMP 01/09/2015  BP, weight, and urine reviewed.  Refer to obstetrical flow sheet for FH & FHR.  Reports good fm.  Denies regular uc's, lof, vb, or uti s/s. Itching on soles of feet throughout day- not worse at night, no itching anywhere else. To try hydrocortisone or benadryl cream. Let us know if progresses/worse at night/palms of hands, etc will do fasting bile acids/cmp GBS collected SVE per request: very uncomfortable w/ exam so stopped Reviewed labor s/s, fkc. Plan:  Continue routine obstetrical care  F/U in 1wk for OB appointment

## 2015-09-22 NOTE — Patient Instructions (Signed)
Call the office ([phone]) or go to [facility] if:  You begin to have strong, frequent contractions  Your water breaks.  Sometimes it is a big gush of fluid, sometimes it is just a trickle that keeps getting your panties wet or running down your legs  You have vaginal bleeding.  It is normal to have a small amount of spotting if your cervix was checked.   You don't feel your baby moving like normal.  If you don't, get you something to eat and drink and lay down and focus on feeling your baby move.  You should feel at least 10 movements in 2 hours.  If you don't, you should call the office or go to [facility].    Braxton Hicks Contractions Contractions of the uterus can occur throughout pregnancy. Contractions are not always a sign that you are in labor.  WHAT ARE BRAXTON HICKS CONTRACTIONS?  Contractions that occur before labor are called Braxton Hicks contractions, or false labor. Toward the end of pregnancy (32-34 weeks), these contractions can develop more often and may become more forceful. This is not true labor because these contractions do not result in opening (dilatation) and thinning of the cervix. They are sometimes difficult to tell apart from true labor because these contractions can be forceful and people have different pain tolerances. You should not feel embarrassed if you go to the hospital with false labor. Sometimes, the only way to tell if you are in true labor is for your health care provider to look for changes in the cervix. If there are no prenatal problems or other health problems associated with the pregnancy, it is completely safe to be sent home with false labor and await the onset of true labor. HOW CAN YOU TELL THE DIFFERENCE BETWEEN TRUE AND FALSE LABOR? False Labor  The contractions of false labor are usually shorter and not as hard as those of true labor.   The contractions are usually irregular.   The contractions are often felt in the front of  the lower abdomen and in the groin.   The contractions may go away when you walk around or change positions while lying down.   The contractions get weaker and are shorter lasting as time goes on.   The contractions do not usually become progressively stronger, regular, and closer together as with true labor.  True Labor  Contractions in true labor last 30-70 seconds, become very regular, usually become more intense, and increase in frequency.   The contractions do not go away with walking.   The discomfort is usually felt in the top of the uterus and spreads to the lower abdomen and low back.   True labor can be determined by your health care provider with an exam. This will show that the cervix is dilating and getting thinner.  WHAT TO REMEMBER  Keep up with your usual exercises and follow other instructions given by your health care provider.   Take medicines as directed by your health care provider.   Keep your regular prenatal appointments.   Eat and drink lightly if you think you are going into labor.   If Braxton Hicks contractions are making you uncomfortable:   Change your position from lying down or resting to walking, or from walking to resting.   Sit and rest in a tub of warm water.   Drink 2-3 glasses of water. Dehydration may cause these contractions.   Do slow and deep breathing several times an hour.    WHEN SHOULD I SEEK IMMEDIATE MEDICAL CARE? Seek immediate medical care if:  Your contractions become stronger, more regular, and closer together.   You have fluid leaking or gushing from your vagina.   You have a fever.   You pass blood-tinged mucus.   You have vaginal bleeding.   You have continuous abdominal pain.   You have low back pain that you never had before.   You feel your baby's head pushing down and causing pelvic pressure.   Your baby is not moving as much as it used to.    This information is not intended to  replace advice given to you by your health care provider. Make sure you discuss any questions you have with your health care provider.   Document Released: 07/31/2005 Document Revised: 08/05/2013 Document Reviewed: 05/12/2013 Elsevier Interactive Patient Education 2016 [employer]  

## 2015-09-23 LAB — GC/CHLAMYDIA PROBE AMP
Chlamydia trachomatis, NAA: NEGATIVE
Neisseria gonorrhoeae by PCR: NEGATIVE

## 2015-09-24 LAB — STREP GP B NAA: STREP GROUP B AG: NEGATIVE

## 2015-09-28 ENCOUNTER — Encounter: Payer: BLUE CROSS/BLUE SHIELD | Admitting: Advanced Practice Midwife

## 2015-09-29 ENCOUNTER — Ambulatory Visit (INDEPENDENT_AMBULATORY_CARE_PROVIDER_SITE_OTHER): Payer: BLUE CROSS/BLUE SHIELD | Admitting: Obstetrics and Gynecology

## 2015-09-29 ENCOUNTER — Encounter: Payer: Self-pay | Admitting: Obstetrics and Gynecology

## 2015-09-29 VITALS — BP 120/78 | HR 97 | Wt 194.0 lb

## 2015-09-29 DIAGNOSIS — Z331 Pregnant state, incidental: Secondary | ICD-10-CM

## 2015-09-29 DIAGNOSIS — Z3403 Encounter for supervision of normal first pregnancy, third trimester: Secondary | ICD-10-CM

## 2015-09-29 DIAGNOSIS — Z1389 Encounter for screening for other disorder: Secondary | ICD-10-CM

## 2015-09-29 LAB — POCT URINALYSIS DIPSTICK
Glucose, UA: NEGATIVE
KETONES UA: NEGATIVE
Leukocytes, UA: NEGATIVE
Nitrite, UA: NEGATIVE
RBC UA: NEGATIVE

## 2015-09-29 NOTE — Progress Notes (Signed)
Patient ID: Lynn Hale, female   DOB: 01-19-1991, 25 y.o.   MRN: [id number]  G1P0 [redacted]w[redacted]d Estimated Date of Delivery: 10/10/15  Blood pressure 120/78, pulse 97, weight 194 lb (87.998 kg), last menstrual period 01/09/2015.   refer to the ob flow sheet for FH and FHR, also BP, Wt, Urine results:notable for trace protein  Patient reports  + good fetal movement, denies any bleeding and no rupture of membranes symptoms or regular contractions. Patient complaints: None. Patient and FOB states they had a [place] tour and classes last week.  FHR: 143 bpm FH: 37 cm  Questions were answered. Assessment: LROB G1P0 @ [redacted]w[redacted]d   Plan:  Continued routine obstetrical care,    F/u in 1 weeks for pnx care      By signing my name below, I, Stephania Fragmin, attest that this documentation has been prepared under the direction and in the presence of Jonnie Kind, MD. Electronically Signed: Stephania Fragmin, ED Scribe. 09/29/2015. 11:26 AM.  I personally performed the services described in this documentation, which was SCRIBED in my presence. The recorded information has been reviewed and considered accurate. It has been edited as necessary during review. Jonnie Kind, MD

## 2015-09-29 NOTE — Progress Notes (Signed)
Pt denies any problems or concerns at this time.  

## 2015-10-05 ENCOUNTER — Encounter: Payer: Self-pay | Admitting: Advanced Practice Midwife

## 2015-10-05 ENCOUNTER — Ambulatory Visit (INDEPENDENT_AMBULATORY_CARE_PROVIDER_SITE_OTHER): Payer: BLUE CROSS/BLUE SHIELD | Admitting: Advanced Practice Midwife

## 2015-10-05 VITALS — BP 120/76 | HR 90 | Wt 194.0 lb

## 2015-10-05 DIAGNOSIS — Z3403 Encounter for supervision of normal first pregnancy, third trimester: Secondary | ICD-10-CM | POA: Diagnosis not present

## 2015-10-05 DIAGNOSIS — Z331 Pregnant state, incidental: Secondary | ICD-10-CM

## 2015-10-05 DIAGNOSIS — Z1389 Encounter for screening for other disorder: Secondary | ICD-10-CM

## 2015-10-05 DIAGNOSIS — Z3A40 40 weeks gestation of pregnancy: Secondary | ICD-10-CM

## 2015-10-05 LAB — POCT URINALYSIS DIPSTICK
GLUCOSE UA: NEGATIVE
KETONES UA: NEGATIVE
Leukocytes, UA: NEGATIVE
Nitrite, UA: NEGATIVE
Protein, UA: NEGATIVE
RBC UA: NEGATIVE

## 2015-10-05 MED ORDER — LANSOPRAZOLE 15 MG PO TBDP: 30.0000 mg | ORAL_TABLET | Freq: Every day | ORAL | Status: DC

## 2015-10-05 NOTE — Progress Notes (Signed)
G1P0 [redacted]w[redacted]d Estimated Date of Delivery: 10/10/15  Blood pressure 120/76, pulse 90, weight 194 lb (87.998 kg), last menstrual period 01/09/2015.   BP weight and urine results all reviewed and noted.  Please refer to the obstetrical flow sheet for the fundal height and fetal heart rate documentation:  Patient reports good fetal movement, denies any bleeding and no rupture of membranes symptoms or regular contractions. Patient is on prilosec, still "horrible heartburn" All questions were answered.  Orders Placed This Encounter  Procedures  . POCT urinalysis dipstick    Plan:  Continued routine obstetrical care, Prevacid (on formulary) for heartburn/reflux  Return in about 1 week (around 10/12/2015) for [place].

## 2015-10-05 NOTE — Progress Notes (Signed)
Pt states that she still has terrible heartburn.

## 2015-10-10 ENCOUNTER — Encounter (HOSPITAL_COMMUNITY): Payer: Self-pay

## 2015-10-10 ENCOUNTER — Inpatient Hospital Stay (HOSPITAL_COMMUNITY): Payer: BLUE CROSS/BLUE SHIELD | Admitting: Anesthesiology

## 2015-10-10 ENCOUNTER — Inpatient Hospital Stay (HOSPITAL_COMMUNITY)
Admission: AD | Admit: 2015-10-10 | Discharge: 2015-10-13 | DRG: 775 | Disposition: A | Discharge status: Home / Self Care | Payer: BLUE CROSS/BLUE SHIELD | Source: Ambulatory Visit | Attending: Family Medicine | Admitting: Family Medicine

## 2015-10-10 DIAGNOSIS — Z6832 Body mass index (BMI) 32.0-32.9, adult: Secondary | ICD-10-CM

## 2015-10-10 DIAGNOSIS — K219 Gastro-esophageal reflux disease without esophagitis: Secondary | ICD-10-CM | POA: Diagnosis present

## 2015-10-10 DIAGNOSIS — Z8249 Family history of ischemic heart disease and other diseases of the circulatory system: Secondary | ICD-10-CM | POA: Diagnosis not present

## 2015-10-10 DIAGNOSIS — O9962 Diseases of the digestive system complicating childbirth: Secondary | ICD-10-CM | POA: Diagnosis present

## 2015-10-10 DIAGNOSIS — O324XX0 Maternal care for high head at term, not applicable or unspecified: Secondary | ICD-10-CM | POA: Diagnosis present

## 2015-10-10 DIAGNOSIS — Z833 Family history of diabetes mellitus: Secondary | ICD-10-CM | POA: Diagnosis not present

## 2015-10-10 DIAGNOSIS — O99214 Obesity complicating childbirth: Secondary | ICD-10-CM | POA: Diagnosis present

## 2015-10-10 DIAGNOSIS — Z3A40 40 weeks gestation of pregnancy: Secondary | ICD-10-CM

## 2015-10-10 DIAGNOSIS — IMO0001 Reserved for inherently not codable concepts without codable children: Secondary | ICD-10-CM

## 2015-10-10 DIAGNOSIS — Z3403 Encounter for supervision of normal first pregnancy, third trimester: Secondary | ICD-10-CM

## 2015-10-10 DIAGNOSIS — E669 Obesity, unspecified: Secondary | ICD-10-CM | POA: Diagnosis present

## 2015-10-10 LAB — TYPE AND SCREEN
ABO/RH(D): A POS
Antibody Screen: NEGATIVE

## 2015-10-10 LAB — CBC
HCT: 36.4 % (ref 36.0–46.0)
Hemoglobin: 12.8 g/dL (ref 12.0–15.0)
MCH: 32.9 pg (ref 26.0–34.0)
MCHC: 35.2 g/dL (ref 30.0–36.0)
MCV: 93.6 fL (ref 78.0–100.0)
PLATELETS: 265 10*3/uL (ref 150–400)
RBC: 3.89 MIL/uL (ref 3.87–5.11)
RDW: 13.5 % (ref 11.5–15.5)
WBC: 12.1 10*3/uL — ABNORMAL HIGH (ref 4.0–10.5)

## 2015-10-10 MED ORDER — OXYTOCIN 10 UNIT/ML IJ SOLN
2.5000 [IU]/h | INTRAVENOUS | Status: DC
  Administered 2015-10-11: 10:00:00 via INTRAVENOUS
  Filled 2015-10-10: qty 4

## 2015-10-10 MED ORDER — PHENYLEPHRINE 40 MCG/ML (10ML) SYRINGE FOR IV PUSH (FOR BLOOD PRESSURE SUPPORT)
80.0000 ug | PREFILLED_SYRINGE | INTRAVENOUS | Status: DC | PRN
  Filled 2015-10-10 (×2): qty 20

## 2015-10-10 MED ORDER — DIPHENHYDRAMINE HCL 50 MG/ML IJ SOLN
12.5000 mg | INTRAMUSCULAR | Status: DC | PRN
Start: 2015-10-10 — End: 2015-10-11
  Filled 2015-10-10: qty 1

## 2015-10-10 MED ORDER — OXYCODONE-ACETAMINOPHEN 5-325 MG PO TABS
1.0000 | ORAL_TABLET | ORAL | Status: DC | PRN
Start: 2015-10-10 — End: 2015-10-11

## 2015-10-10 MED ORDER — OXYTOCIN 10 UNIT/ML IJ SOLN: 10.0000 [IU] | Freq: Once | INTRAMUSCULAR | Status: DC

## 2015-10-10 MED ORDER — SODIUM CHLORIDE 0.9% FLUSH: 3.0000 mL | INTRAVENOUS | Status: DC | PRN

## 2015-10-10 MED ORDER — LACTATED RINGERS IV SOLN
500.0000 mL | INTRAVENOUS | Status: DC | PRN
  Administered 2015-10-11: 1000 mL via INTRAVENOUS

## 2015-10-10 MED ORDER — HYDROXYZINE HCL 50 MG PO TABS: 50.0000 mg | ORAL_TABLET | Freq: Four times a day (QID) | ORAL | Status: DC | PRN

## 2015-10-10 MED ORDER — LACTATED RINGERS IV SOLN: 500.0000 mL | Freq: Once | INTRAVENOUS | Status: DC

## 2015-10-10 MED ORDER — OXYTOCIN BOLUS FROM INFUSION
500.0000 mL | INTRAVENOUS | Status: DC
  Administered 2015-10-11: 500 mL via INTRAVENOUS

## 2015-10-10 MED ORDER — PHENYLEPHRINE 40 MCG/ML (10ML) SYRINGE FOR IV PUSH (FOR BLOOD PRESSURE SUPPORT): 80.0000 ug | PREFILLED_SYRINGE | INTRAVENOUS | Status: DC | PRN

## 2015-10-10 MED ORDER — LIDOCAINE HCL (PF) 1 % IJ SOLN
INTRAMUSCULAR | Status: DC | PRN
  Administered 2015-10-10 (×2): 4 mL via EPIDURAL

## 2015-10-10 MED ORDER — SODIUM CHLORIDE 0.9 % IV SOLN: 250.0000 mL | INTRAVENOUS | Status: DC | PRN

## 2015-10-10 MED ORDER — CITRIC ACID-SODIUM CITRATE 334-500 MG/5ML PO SOLN: 30.0000 mL | ORAL | Status: DC | PRN

## 2015-10-10 MED ORDER — FENTANYL CITRATE (PF) 100 MCG/2ML IJ SOLN: 50.0000 ug | INTRAMUSCULAR | Status: DC | PRN

## 2015-10-10 MED ORDER — ACETAMINOPHEN 325 MG PO TABS: 650.0000 mg | ORAL_TABLET | ORAL | Status: DC | PRN

## 2015-10-10 MED ORDER — LIDOCAINE HCL (PF) 1 % IJ SOLN
30.0000 mL | INTRAMUSCULAR | Status: AC | PRN
  Administered 2015-10-11: 30 mL via SUBCUTANEOUS
  Filled 2015-10-10: qty 30

## 2015-10-10 MED ORDER — SODIUM CHLORIDE 0.9% FLUSH: 3.0000 mL | Freq: Two times a day (BID) | INTRAVENOUS | Status: DC

## 2015-10-10 MED ORDER — ONDANSETRON HCL 4 MG/2ML IJ SOLN
4.0000 mg | Freq: Four times a day (QID) | INTRAMUSCULAR | Status: DC | PRN
  Administered 2015-10-11: 4 mg via INTRAVENOUS
  Filled 2015-10-10 (×2): qty 2

## 2015-10-10 MED ORDER — EPHEDRINE 5 MG/ML INJ: 10.0000 mg | INTRAVENOUS | Status: DC | PRN

## 2015-10-10 MED ORDER — FENTANYL 2.5 MCG/ML BUPIVACAINE 1/10 % EPIDURAL INFUSION (WH - ANES)
14.0000 mL/h | INTRAMUSCULAR | Status: DC | PRN
  Administered 2015-10-10 – 2015-10-11 (×3): 14 mL/h via EPIDURAL
  Filled 2015-10-10 (×3): qty 125

## 2015-10-10 MED ORDER — OXYCODONE-ACETAMINOPHEN 5-325 MG PO TABS: 2.0000 | ORAL_TABLET | ORAL | Status: DC | PRN

## 2015-10-10 MED ORDER — LACTATED RINGERS IV SOLN
INTRAVENOUS | Status: DC
  Administered 2015-10-10 – 2015-10-11 (×3): via INTRAVENOUS

## 2015-10-10 NOTE — Anesthesia Procedure Notes (Signed)
Epidural Patient location during procedure: OB Start time: 10/10/2015 9:36 PM  Staffing Anesthesiologist: Josephine Igo  Preanesthetic Checklist Completed: patient identified, site marked, surgical consent, pre-op evaluation, timeout performed, IV checked, risks and benefits discussed and monitors and equipment checked  Epidural Patient position: sitting Prep: site prepped and draped and DuraPrep Patient monitoring: continuous pulse ox and blood pressure Approach: midline Location: L3-L4 Injection technique: LOR air  Needle:  Needle type: Tuohy  Needle gauge: 17 G Needle length: 9 cm and 9 Needle insertion depth: 6 cm Catheter type: closed end flexible Catheter size: 19 Gauge Catheter at skin depth: 11 cm Test dose: negative and Other  Assessment Events: blood not aspirated, injection not painful, no injection resistance, negative IV test and no paresthesia  Additional Notes Patient identified. Risks and benefits discussed including failed block, incomplete  Pain control, post dural puncture headache, nerve damage, paralysis, blood pressure Changes, nausea, vomiting, reactions to medications-both toxic and allergic and post Partum back pain. All questions were answered. Patient expressed understanding and wished to proceed. Sterile technique was used throughout procedure. Epidural site was Dressed with sterile barrier dressing. No paresthesias, signs of intravascular injection Or signs of intrathecal spread were encountered.  Patient was more comfortable after the epidural was dosed. Please see RN's note for documentation of vital signs and FHR which are stable.

## 2015-10-10 NOTE — MAU Note (Signed)
Contracting every 7-9 min. No bleeding or leaking.  Was 1.5cm when last checked

## 2015-10-10 NOTE — Anesthesia Preprocedure Evaluation (Signed)
Anesthesia Evaluation  Patient identified by MRN, date of birth, ID band Patient awake    Reviewed: Allergy & Precautions, NPO status , Patient's Chart, lab work & pertinent test results  Airway Mallampati: II  TM Distance: >3 FB Neck ROM: Full    Dental no notable dental hx. (+) Teeth Intact   Pulmonary neg pulmonary ROS,    Pulmonary exam normal breath sounds clear to auscultation       Cardiovascular negative cardio ROS Normal cardiovascular exam Rhythm:Regular Rate:Normal     Neuro/Psych negative neurological ROS  negative psych ROS   GI/Hepatic Neg liver ROS, GERD  Medicated and Controlled,  Endo/Other  Obesity  Renal/GU negative Renal ROS  negative genitourinary   Musculoskeletal negative musculoskeletal ROS (+)   Abdominal (+) + obese,   Peds  Hematology negative hematology ROS (+)   Anesthesia Other Findings   Reproductive/Obstetrics (+) Pregnancy                             Anesthesia Physical Anesthesia Plan  ASA: II  Anesthesia Plan: Epidural   Post-op Pain Management:    Induction:   Airway Management Planned: Natural Airway  Additional Equipment:   Intra-op Plan:   Post-operative Plan:   Informed Consent: I have reviewed the patients History and Physical, chart, labs and discussed the procedure including the risks, benefits and alternatives for the proposed anesthesia with the patient or authorized representative who has indicated his/her understanding and acceptance.     Plan Discussed with: Anesthesiologist  Anesthesia Plan Comments:         Anesthesia Quick Evaluation

## 2015-10-10 NOTE — H&P (Signed)
Lynn Hale is a 25 y.o. female presenting for early labor. Contractions started at 1500 Maternal Medical History:  Reason for admission: Contractions.   Contractions: Onset was 6-12 hours ago.   Frequency: regular.    Fetal activity: Perceived fetal activity is normal.    Prenatal complications: no prenatal complications Prenatal Complications - Diabetes: none.    OB History    Gravida Para Term Preterm AB TAB SAB Ectopic Multiple Living   1              Past Medical History  Diagnosis Date  . Pregnant 02/10/2015  . Vaginal bleeding in pregnancy 02/17/2015  . Medical history non-contributory    Past Surgical History  Procedure Laterality Date  . Wisdom tooth extraction     Family History: family history includes Cancer in her mother; Dementia in her paternal grandfather; Diabetes in her maternal grandmother; Glaucoma in her paternal grandmother; Heart disease in her maternal grandmother. Social History:  reports that she has never smoked. She has never used smokeless tobacco. She reports that she does not drink alcohol or use illicit drugs.   Prenatal Transfer Tool  Maternal Diabetes: No Genetic Screening: Normal Maternal Ultrasounds/Referrals: Normal Fetal Ultrasounds or other Referrals:  None Maternal Substance Abuse:  No Significant Maternal Medications:  None Significant Maternal Lab Results:  None Other Comments:  None  Review of Systems  Constitutional: Negative.   HENT: Negative.   Eyes: Negative.   Respiratory: Negative.   Cardiovascular: Negative.   Gastrointestinal: Positive for abdominal pain.  Genitourinary: Negative.   Musculoskeletal: Negative.   Skin: Negative.   Neurological: Negative.   Endo/Heme/Allergies: Negative.   Psychiatric/Behavioral: Negative.     Dilation: 4 Effacement (%): 80 Station: -2 Exam by:: d lawson,cnm Blood pressure 130/71, pulse 92, temperature 98.7 F (37.1 C), temperature source Oral, resp. rate 18, last  menstrual period 01/09/2015. Maternal Exam:  Uterine Assessment: Contraction strength is mild.  Contraction frequency is regular.   Abdomen: Patient reports no abdominal tenderness. Fetal presentation: vertex  Introitus: Normal vulva. Normal vagina.  Pelvis: adequate for delivery.   Cervix: Cervix evaluated by digital exam.   SVE 4/90/-2  Fetal Exam Fetal Monitor Review: Mode: ultrasound.   Variability: moderate (6-25 bpm).    Fetal State Assessment: Category I - tracings are normal.     Physical Exam  Constitutional: She is oriented to person, place, and time. She appears well-developed and well-nourished.  HENT:  Head: Normocephalic.  Eyes: Pupils are equal, round, and reactive to light.  Neck: Normal range of motion.  Cardiovascular: Normal rate, regular rhythm, normal heart sounds and intact distal pulses.   Respiratory: Effort normal and breath sounds normal.  GI: Soft. Bowel sounds are normal.  Genitourinary: Vagina normal and uterus normal.  Musculoskeletal: Normal range of motion.  Neurological: She is alert and oriented to person, place, and time. She has normal reflexes.  Skin: Skin is warm and dry.  Psychiatric: She has a normal mood and affect. Her behavior is normal. Judgment and thought content normal.    Prenatal labs: ABO, Rh: A/Positive/-- (07/27 0936) Antibody: Negative (11/29 0730) Rubella: <0.90 (07/27 0936) RPR: Non Reactive (11/29 0730)  HBsAg: Negative (07/27 0936)  HIV: Non Reactive (11/29 0730)  GBS: Negative (02/08 1245)   Assessment/Plan: Early lavor, GBS neg. Will admiit   LAWSON, MARIE DARLENE 10/10/2015, 8:41 PM

## 2015-10-10 NOTE — Progress Notes (Signed)
   Lynn Hale is a 25 y.o. G1P0 at [redacted]w[redacted]d  admitted for early labor  Subjective: Patient with significant contractions, would like an epidural   Objective: Filed Vitals:   10/10/15 2200 10/10/15 2201 10/10/15 2202 10/10/15 2205  BP: 112/59  112/59 111/64  Pulse: 95 95 93 97  Temp:      TempSrc:      Resp:      Height:      Weight:      SpO2: 97% 97%        FHT:  FHR: 150 bpm, variability: moderate,  accelerations:  Present,  decelerations:  Absent UC:   irregular, every 2-4 minutes SVE:   Dilation: 4 Effacement (%): 80 Station: -2 Exam by:: d lawson,cnm   Labs: Lab Results  Component Value Date   WBC 12.1* 10/10/2015   HGB 12.8 10/10/2015   HCT 36.4 10/10/2015   MCV 93.6 10/10/2015   PLT 265 10/10/2015    Assessment / Plan: Spontaneous labor, progressing normally  Labor: Progressing normally Fetal Wellbeing:  Category I Pain Control:  Epidural Anticipated MOD:  NSVD  Lynn Hale 10/10/2015, 10:35 PM

## 2015-10-11 ENCOUNTER — Encounter (HOSPITAL_COMMUNITY): Payer: Self-pay | Admitting: *Deleted

## 2015-10-11 DIAGNOSIS — O99214 Obesity complicating childbirth: Secondary | ICD-10-CM

## 2015-10-11 DIAGNOSIS — O324XX0 Maternal care for high head at term, not applicable or unspecified: Secondary | ICD-10-CM

## 2015-10-11 DIAGNOSIS — Z3A40 40 weeks gestation of pregnancy: Secondary | ICD-10-CM

## 2015-10-11 DIAGNOSIS — O9962 Diseases of the digestive system complicating childbirth: Secondary | ICD-10-CM

## 2015-10-11 LAB — RPR: RPR: NONREACTIVE

## 2015-10-11 LAB — ABO/RH: ABO/RH(D): A POS

## 2015-10-11 MED ORDER — SIMETHICONE 80 MG PO CHEW: 80.0000 mg | CHEWABLE_TABLET | ORAL | Status: DC | PRN

## 2015-10-11 MED ORDER — DOCUSATE SODIUM 100 MG PO CAPS
100.0000 mg | ORAL_CAPSULE | Freq: Two times a day (BID) | ORAL | Status: DC
  Administered 2015-10-11 – 2015-10-13 (×4): 100 mg via ORAL
  Filled 2015-10-11 (×4): qty 1

## 2015-10-11 MED ORDER — BENZOCAINE-MENTHOL 20-0.5 % EX AERO
1.0000 "application " | INHALATION_SPRAY | CUTANEOUS | Status: DC | PRN
  Administered 2015-10-11: 1 via TOPICAL
  Filled 2015-10-11 (×2): qty 56

## 2015-10-11 MED ORDER — OXYTOCIN 10 UNIT/ML IJ SOLN: 2.5000 [IU]/h | INTRAMUSCULAR | Status: DC | PRN

## 2015-10-11 MED ORDER — ZOLPIDEM TARTRATE 5 MG PO TABS: 5.0000 mg | ORAL_TABLET | Freq: Every evening | ORAL | Status: DC | PRN

## 2015-10-11 MED ORDER — DIPHENHYDRAMINE HCL 25 MG PO CAPS
25.0000 mg | ORAL_CAPSULE | Freq: Four times a day (QID) | ORAL | Status: DC | PRN
  Administered 2015-10-11: 25 mg via ORAL
  Filled 2015-10-11: qty 1

## 2015-10-11 MED ORDER — FLEET ENEMA 7-19 GM/118ML RE ENEM: 1.0000 | ENEMA | Freq: Every day | RECTAL | Status: DC | PRN

## 2015-10-11 MED ORDER — WITCH HAZEL-GLYCERIN EX PADS: 1.0000 "application " | MEDICATED_PAD | CUTANEOUS | Status: DC | PRN

## 2015-10-11 MED ORDER — METHYLERGONOVINE MALEATE 0.2 MG PO TABS: 0.2000 mg | ORAL_TABLET | ORAL | Status: DC | PRN

## 2015-10-11 MED ORDER — ONDANSETRON HCL 4 MG/2ML IJ SOLN: 4.0000 mg | INTRAMUSCULAR | Status: DC | PRN

## 2015-10-11 MED ORDER — MEASLES, MUMPS & RUBELLA VAC ~~LOC~~ INJ
0.5000 mL | INJECTION | Freq: Once | SUBCUTANEOUS | Status: DC
  Filled 2015-10-11: qty 0.5

## 2015-10-11 MED ORDER — LANOLIN HYDROUS EX OINT: TOPICAL_OINTMENT | CUTANEOUS | Status: DC | PRN

## 2015-10-11 MED ORDER — ACETAMINOPHEN 325 MG PO TABS: 650.0000 mg | ORAL_TABLET | ORAL | Status: DC | PRN

## 2015-10-11 MED ORDER — OXYCODONE-ACETAMINOPHEN 5-325 MG PO TABS
2.0000 | ORAL_TABLET | ORAL | Status: DC | PRN
  Administered 2015-10-11: 2 via ORAL
  Filled 2015-10-11: qty 2

## 2015-10-11 MED ORDER — OXYCODONE-ACETAMINOPHEN 5-325 MG PO TABS
1.0000 | ORAL_TABLET | ORAL | Status: DC | PRN
  Administered 2015-10-11 – 2015-10-13 (×6): 1 via ORAL
  Filled 2015-10-11 (×5): qty 1

## 2015-10-11 MED ORDER — TETANUS-DIPHTH-ACELL PERTUSSIS 5-2.5-18.5 LF-MCG/0.5 IM SUSP: 0.5000 mL | Freq: Once | INTRAMUSCULAR | Status: DC

## 2015-10-11 MED ORDER — METHYLERGONOVINE MALEATE 0.2 MG/ML IJ SOLN: 0.2000 mg | INTRAMUSCULAR | Status: DC | PRN

## 2015-10-11 MED ORDER — BUPIVACAINE HCL (PF) 0.25 % IJ SOLN
INTRAMUSCULAR | Status: DC | PRN
  Administered 2015-10-11 (×2): 5 mL via EPIDURAL

## 2015-10-11 MED ORDER — DIBUCAINE 1 % RE OINT
1.0000 "application " | TOPICAL_OINTMENT | RECTAL | Status: DC | PRN
  Filled 2015-10-11: qty 28

## 2015-10-11 MED ORDER — BISACODYL 10 MG RE SUPP
10.0000 mg | Freq: Every day | RECTAL | Status: DC | PRN
  Filled 2015-10-11: qty 1

## 2015-10-11 MED ORDER — SODIUM BICARBONATE 8.4 % IV SOLN
INTRAVENOUS | Status: DC | PRN
  Administered 2015-10-11: 3 mL via EPIDURAL

## 2015-10-11 MED ORDER — IBUPROFEN 600 MG PO TABS
600.0000 mg | ORAL_TABLET | Freq: Four times a day (QID) | ORAL | Status: DC
  Administered 2015-10-11 – 2015-10-13 (×10): 600 mg via ORAL
  Filled 2015-10-11 (×10): qty 1

## 2015-10-11 MED ORDER — ONDANSETRON HCL 4 MG PO TABS: 4.0000 mg | ORAL_TABLET | ORAL | Status: DC | PRN

## 2015-10-11 MED ORDER — PRENATAL MULTIVITAMIN CH
1.0000 | ORAL_TABLET | Freq: Every day | ORAL | Status: DC
  Administered 2015-10-11 – 2015-10-13 (×3): 1 via ORAL
  Filled 2015-10-11 (×3): qty 1

## 2015-10-11 NOTE — Progress Notes (Signed)
Since patient is uncomfortable again, discussed with patient and family to start pushing before contraction pain returns, even though the left hip pain remains. Patient and family agree

## 2015-10-11 NOTE — Progress Notes (Signed)
   Lynn Hale is a 25 y.o. G1P0 at [redacted]w[redacted]d  admitted for active labor  Subjective: Doing well. Has a headache. No blurred vision, chest pain, SOB, swelling.  Tolerating contractions with epidural. Complete.   Objective: Filed Vitals:   10/11/15 0232 10/11/15 0301 10/11/15 0331 10/11/15 0402  BP: 95/52 106/59 104/71 111/67  Pulse: 85 83 85 77  Temp:      TempSrc:      Resp:      Height:      Weight:      SpO2:          FHT:  FHR: 140 bpm, variability: moderate,  accelerations:  Present,  decelerations:  Absent UC:   regular, every 3-4 minutes SVE:   Dilation: 10 Effacement (%): 100 Station: -1 Exam by:: Alvin Critchley, RN  Labs: Lab Results  Component Value Date   WBC 12.1* 10/10/2015   HGB 12.8 10/10/2015   HCT 36.4 10/10/2015   MCV 93.6 10/10/2015   PLT 265 10/10/2015    Assessment / Plan: Spontaneous labor, progressing normally  Labor: Progressing normally. Will AROM and patient to start trial pushes (still high)  Fetal Wellbeing:  Category I Pain Control:  Epidural Anticipated MOD:  NSVD  Denice Cardon 10/11/2015, 4:40 AM

## 2015-10-11 NOTE — Anesthesia Postprocedure Evaluation (Signed)
Anesthesia Post Note  Patient: Lynn Hale  Procedure(s) Performed: * No procedures listed *  Patient location during evaluation: Mother Baby Anesthesia Type: Epidural Level of consciousness: awake Pain management: pain level controlled Vital Signs Assessment: post-procedure vital signs reviewed and stable Respiratory status: spontaneous breathing Cardiovascular status: stable Postop Assessment: no headache, no backache, epidural receding, patient able to bend at knees, no signs of nausea or vomiting and adequate PO intake Anesthetic complications: no    Last Vitals:  Filed Vitals:   10/11/15 1210 10/11/15 1310  BP: 122/68 120/64  Pulse: 78 84  Temp: 36.7 C 36.8 C  Resp: 19 18    Last Pain:  Filed Vitals:   10/11/15 1336  PainSc: 4                  Chastelyn Athens

## 2015-10-11 NOTE — Progress Notes (Signed)
Since patient a bit more comfortable, called Lynn Hale and will attempt to labor down for a time.

## 2015-10-11 NOTE — Progress Notes (Signed)
Not pushing at this time.

## 2015-10-11 NOTE — Progress Notes (Signed)
Pulse palpates 90 bpm.

## 2015-10-11 NOTE — Progress Notes (Signed)
Pulse palpated at 80 bpm. Patient shivering during BP readings.

## 2015-10-11 NOTE — Progress Notes (Signed)
Gweneth Fritter, CRNA remains at bedside after epidural redose. Once perineal/contraction pain gone, able to determine L hip pain is sciatica. CRNA explains that epidural will not take away nerve pain. We then begin more massage with some minor relief.

## 2015-10-12 ENCOUNTER — Encounter: Payer: BLUE CROSS/BLUE SHIELD | Admitting: Women's Health

## 2015-10-12 NOTE — Progress Notes (Signed)
Post Partum Day #1 Subjective: no complaints, up ad lib, tolerating PO and + flatus  Objective: Blood pressure 108/60, pulse 88, temperature 98.2 F (36.8 C), temperature source Oral, resp. rate 18, height 5\' 5"  (1.651 m), weight 87.998 kg (194 lb), last menstrual period 01/09/2015, SpO2 100 %, unknown if currently breastfeeding.  Physical Exam:  General: alert, cooperative, fatigued and no distress  CV: RRR, no MRG Pulm: CTAB Lochia: deferred Uterine Fundus: firm Incision: NA DVT Evaluation: No evidence of DVT seen on physical exam. Negative Homan's sign. No cords or calf tenderness. Calf/Ankle edema is present.   Recent Labs  10/10/15 2055  HGB 12.8  HCT 36.4    Assessment/Plan: Plan for discharge tomorrow and Contraception OCP, formula feeding   LOS: 2 days   Gweneth Fritter 10/12/2015, 7:29 AM   OB fellow attestation: I have seen and examined this patient; I agree with above documentation in the resident's note.   Caren Macadam, MD 5:07 PM

## 2015-10-13 MED ORDER — IBUPROFEN 600 MG PO TABS: 600.0000 mg | ORAL_TABLET | Freq: Four times a day (QID) | ORAL | Status: DC

## 2015-10-13 MED ORDER — NORGESTIMATE-ETH ESTRADIOL 0.25-35 MG-MCG PO TABS: 1.0000 | ORAL_TABLET | Freq: Every day | ORAL | Status: DC

## 2015-10-13 NOTE — Progress Notes (Cosign Needed)
Post Partum Day #2 Subjective: no complaints, up ad lib, voiding, tolerating PO and + flatus. She is overall doing well. Some mild vaginal and back soreness as well as urinary incontinence. She says she is ready to go home today.    Objective: Blood pressure 103/64, pulse 91, temperature 98 F (36.7 C), temperature source Oral, resp. rate 18, height 5\' 5"  (1.651 m), weight 87.998 kg (194 lb), last menstrual period 01/09/2015, SpO2 100 %, unknown if currently breastfeeding.  Physical Exam:  General: alert, cooperative, fatigued and no distress Lochia: deferred Uterine Fundus: firm Incision: NA DVT Evaluation: No evidence of DVT seen on physical exam. Negative Homan's sign. No cords or calf tenderness. Calf/Ankle edema is present. CV: RRR, no MRG Pulm: CTAB   Recent Labs  10/10/15 2055  HGB 12.8  HCT 36.4    Assessment/Plan: Discharge home and Contraception OCP in 30 days   LOS: 3 days   Gweneth Fritter 10/13/2015, 7:38 AM

## 2015-10-13 NOTE — Discharge Instructions (Signed)

## 2015-10-13 NOTE — Discharge Summary (Signed)
OB Discharge Summary     Patient Name: Lynn Hale DOB: 13-May-1991 MRN: [id number]  Date of admission: 10/10/2015 Delivering MD: Lauretta Chester NILES   Date of discharge: 10/13/2015  Admitting diagnosis: 31 WKS, LABOR Intrauterine pregnancy: [redacted]w[redacted]d     Secondary diagnosis:  Active Problems:   Active labor  Additional problems: Failure to descend, maternal fatigue     Discharge diagnosis: Term Pregnancy Delivered                                                                                                Post partum procedures:none  Augmentation: none  Complications: None  Hospital course:  Onset of Labor With Vaginal Delivery     25 y.o. yo G1P1001 at [redacted]w[redacted]d was admitted in Active Labor on 10/10/2015. Patient had an uncomplicated labor course as follows:  Membrane Rupture Time/Date: 4:34 AM ,10/11/2015   Intrapartum Procedures: Episiotomy: None [1]                                         Lacerations:  2nd degree [3];Perineal [11]  Patient had a delivery of a Viable infant. 10/11/2015  Information for the patient's newborn:  Samon, Ptacek [id number]  Delivery Method: Vaginal, Vacuum (Extractor) (Filed from Delivery Summary)    Pateint had an uncomplicated postpartum course.  She is ambulating, tolerating a regular diet, passing flatus, and urinating well. Patient is discharged home in stable condition on 10/13/2015.    Physical exam  Filed Vitals:   10/11/15 1710 10/12/15 0608 10/12/15 1700 10/13/15 0646  BP: 111/62 108/60 111/70 103/64  Pulse: 86 88 116 91  Temp: 98.1 F (36.7 C) 98.2 F (36.8 C) 97.6 F (36.4 C) 98 F (36.7 C)  TempSrc: Oral Oral Tympanic Oral  Resp: 17 18 18 18   Height:      Weight:      SpO2:       General: alert, cooperative and no distress Lochia: appropriate Uterine Fundus: firm Incision: Healing well with no significant drainage DVT Evaluation: No evidence of DVT seen on physical exam. Labs: Lab Results  Component  Value Date   WBC 12.1* 10/10/2015   HGB 12.8 10/10/2015   HCT 36.4 10/10/2015   MCV 93.6 10/10/2015   PLT 265 10/10/2015   CMP Latest Ref Rng 02/21/2015  Glucose 65 - 99 mg/dL 92  BUN 6 - 20 mg/dL 8  Creatinine 0.44 - 1.00 mg/dL 0.53  Sodium 135 - 145 mmol/L 135  Potassium 3.5 - 5.1 mmol/L 3.8  Chloride 101 - 111 mmol/L 104  CO2 22 - 32 mmol/L 23  Calcium 8.9 - 10.3 mg/dL 9.0  Total Protein 6.5 - 8.1 g/dL 6.9  Total Bilirubin 0.3 - 1.2 mg/dL 0.4  Alkaline Phos 38 - 126 U/L 43  AST 15 - 41 U/L 14(L)  ALT 14 - 54 U/L 11(L)    Discharge instruction: per After Visit Summary and "Baby and Me Booklet".  After visit meds:    Medication List  STOP taking these medications        lansoprazole 15 MG disintegrating tablet  Commonly known as:  PREVACID SOLUTAB      TAKE these medications        acetaminophen 500 MG tablet  Commonly known as:  TYLENOL  Take 1,000 mg by mouth every 6 (six) hours as needed for mild pain or headache.     calcium carbonate 500 MG chewable tablet  Commonly known as:  TUMS - dosed in mg elemental calcium  Chew 2 tablets by mouth as needed for indigestion or heartburn.     ibuprofen 600 MG tablet  Commonly known as:  ADVIL,MOTRIN  Take 1 tablet (600 mg total) by mouth every 6 (six) hours.     omeprazole 20 MG capsule  Commonly known as:  PRILOSEC  Take 1 capsule (20 mg total) by mouth daily.     prenatal multivitamin Tabs tablet  Take 1 tablet by mouth daily.        Diet: routine diet  Activity: Advance as tolerated. Pelvic rest for 6 weeks.   Outpatient follow up:6 weeks Follow up Appt:No future appointments. Follow up Visit:No Follow-up on file.  Postpartum contraception: COCs  Newborn Data: Live born female  Birth Weight: 8 lb 7.6 oz (3845 g) APGAR: 6, 9  Baby Feeding: bottle Disposition:home with mother   10/13/2015 Hansel Feinstein, CNM

## 2015-11-24 ENCOUNTER — Ambulatory Visit (INDEPENDENT_AMBULATORY_CARE_PROVIDER_SITE_OTHER): Payer: BLUE CROSS/BLUE SHIELD | Admitting: Women's Health

## 2015-11-24 ENCOUNTER — Encounter: Payer: Self-pay | Admitting: Women's Health

## 2015-11-24 NOTE — Progress Notes (Signed)
Patient ID: Lynn Hale, female   DOB: 03/09/1991, 25 y.o.   MRN: [id number] Subjective:    Lynn Hale is a 25 y.o. G28P1001 Caucasian female who presents for a postpartum visit. She is 6 weeks postpartum following a vacuum, low at 40.2 gestational weeks. Anesthesia: epidural. I have fully reviewed the prenatal and intrapartum course. Postpartum course has been uncomplicated. Baby's course has been uncomplicated. Baby is feeding by bottle. Bleeding no bleeding. Bowel function is normal. Bladder function is normal. Patient is not sexually active. Last sexual activity: prior to birth of baby. Contraception method is rx'd sprintec to begin 2wk pp at d/c, so has been taking for 4wks now. Postpartum depression screening: negative. Score 0.  Last pap 18yrs ago and was normal per report.  The following portions of the patient's history were reviewed and updated as appropriate: allergies, current medications, past medical history, past surgical history and problem list.  Review of Systems Pertinent items are noted in HPI.   Filed Vitals:   11/24/15 0900  BP: 110/70  Pulse: 76  Weight: 177 lb (80.287 kg)   Patient's last menstrual period was 11/15/2015.  Objective:   General:  alert, cooperative and no distress   Breasts:  deferred, no complaints  Lungs: clear to auscultation bilaterally  Heart:  regular rate and rhythm  Abdomen: soft, nontender   Vulva: normal  Vagina: normal vagina  Cervix:  closed  Corpus: Well-involuted  Adnexa:  Non-palpable  Rectal Exam: No hemorrhoids        Assessment:   Postpartum exam 6 wks s/p VAVB Bottlefeeding Depression screening Contraception counseling   Plan:   Contraception: continue sprintec Follow up in: 2 months for pap & physical and coc f/u, or earlier if needed  Tawnya Crook CNM, [facility] 11/24/2015 9:10 AM

## 2015-11-24 NOTE — Patient Instructions (Signed)
Oral Contraception Use Oral contraceptive pills (OCPs) are medicines taken to prevent pregnancy. OCPs work by preventing the ovaries from releasing eggs. The hormones in OCPs also cause the cervical mucus to thicken, preventing the sperm from entering the uterus. The hormones also cause the uterine lining to become thin, not allowing a fertilized egg to attach to the inside of the uterus. OCPs are highly effective when taken exactly as prescribed. However, OCPs do not prevent sexually transmitted diseases (STDs). Safe sex practices, such as using condoms along with an OCP, can help prevent STDs. Before taking OCPs, you may have a physical exam and Pap test. Your health care provider may also order blood tests if necessary. Your health care provider will make sure you are a good candidate for oral contraception. Discuss with your health care provider the possible side effects of the OCP you may be prescribed. When starting an OCP, it can take 2 to 3 months for the body to adjust to the changes in hormone levels in your body.  HOW TO TAKE ORAL CONTRACEPTIVE PILLS Your health care provider may advise you on how to start taking the first cycle of OCPs. Otherwise, you can:   Start on day 1 of your menstrual period. You will not need any backup contraceptive protection with this start time.   Start on the first Sunday after your menstrual period or the day you get your prescription. In these cases, you will need to use backup contraceptive protection for the first week.   Start the pill at any time of your cycle. If you take the pill within 5 days of the start of your period, you are protected against pregnancy right away. In this case, you will not need a backup form of birth control. If you start at any other time of your menstrual cycle, you will need to use another form of birth control for 7 days. If your OCP is the type called a minipill, it will protect you from pregnancy after taking it for 2 days (48  hours). After you have started taking OCPs:   If you forget to take 1 pill, take it as soon as you remember. Take the next pill at the regular time.   If you miss 2 or more pills, call your health care provider because different pills have different instructions for missed doses. Use backup birth control until your next menstrual period starts.   If you use a 28-day pack that contains inactive pills and you miss 1 of the last 7 pills (pills with no hormones), it will not matter. Throw away the rest of the non-hormone pills and start a new pill pack.  No matter which day you start the OCP, you will always start a new pack on that same day of the week. Have an extra pack of OCPs and a backup contraceptive method available in case you miss some pills or lose your OCP pack.  HOME CARE INSTRUCTIONS   Do not smoke.   Always use a condom to protect against STDs. OCPs do not protect against STDs.   Use a calendar to mark your menstrual period days.   Read the information and directions that came with your OCP. Talk to your health care provider if you have questions.  SEEK MEDICAL CARE IF:   You develop nausea and vomiting.   You have abnormal vaginal discharge or bleeding.   You develop a rash.   You miss your menstrual period.   You are losing   your hair.   You need treatment for mood swings or depression.   You get dizzy when taking the OCP.   You develop acne from taking the OCP.   You become pregnant.  SEEK IMMEDIATE MEDICAL CARE IF:   You develop chest pain.   You develop shortness of breath.   You have an uncontrolled or severe headache.   You develop numbness or slurred speech.   You develop visual problems.   You develop pain, redness, and swelling in the legs.    This information is not intended to replace advice given to you by your health care provider. Make sure you discuss any questions you have with your health care provider.   Document  Released: 07/20/2011 Document Revised: 08/21/2014 Document Reviewed: 01/19/2013 Elsevier Interactive Patient Education 2016 Elsevier Inc.  

## 2015-12-17 ENCOUNTER — Telehealth: Payer: Self-pay | Admitting: *Deleted

## 2015-12-17 NOTE — Telephone Encounter (Signed)
Return to work note completed and faxed per pt request.

## 2015-12-27 ENCOUNTER — Telehealth: Payer: Self-pay | Admitting: Obstetrics & Gynecology

## 2015-12-27 NOTE — Telephone Encounter (Signed)
Note refaxed per pt request.

## 2015-12-28 ENCOUNTER — Telehealth: Payer: Self-pay | Admitting: *Deleted

## 2015-12-28 NOTE — Telephone Encounter (Signed)
Pt states she did not want 12 weeks FMLA, she only needed 6 weeks of FMLA after delivery, RTW 12/13/2015. Return to work note refaxed to [phone] ATTN: Cindy per pt request.

## 2016-02-09 ENCOUNTER — Other Ambulatory Visit: Payer: BLUE CROSS/BLUE SHIELD | Admitting: Adult Health

## 2016-05-12 IMAGING — US US ABDOMEN LIMITED
1 series · 8 of 8 positions shown · non-contrast
Comparison: None.

CLINICAL DATA: Right lower quadrant pain

EXAM:
LIMITED ABDOMINAL ULTRASOUND
TECHNIQUE: Gray scale imaging of the right lower quadrant was performed to
evaluate for suspected appendicitis. Standard imaging planes and
graded compression technique were utilized.

[Series 1: us abdomen limited · 0.13mm/px · 8 of 8 slices shown]
[im 1/8]
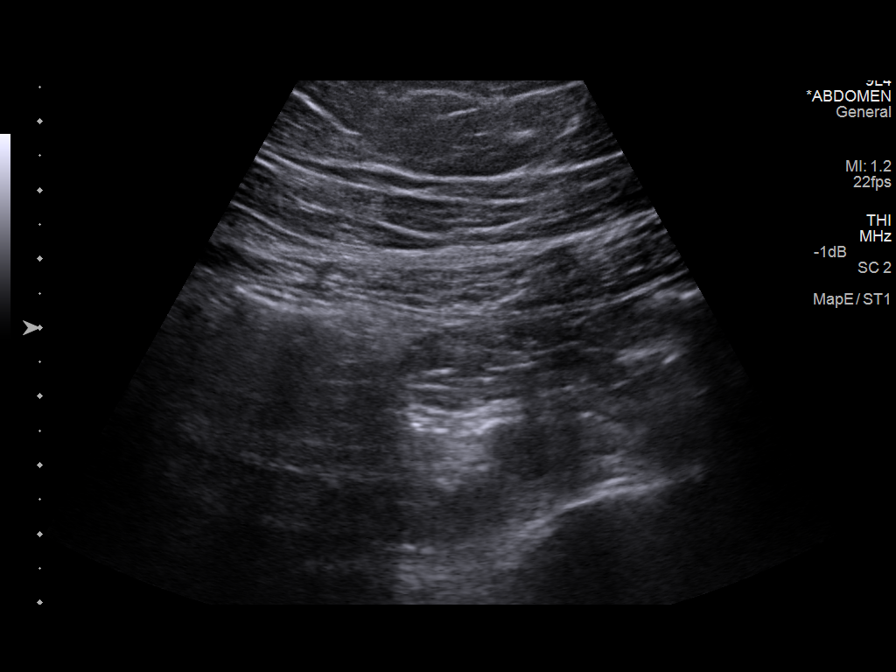
[im 2/8]
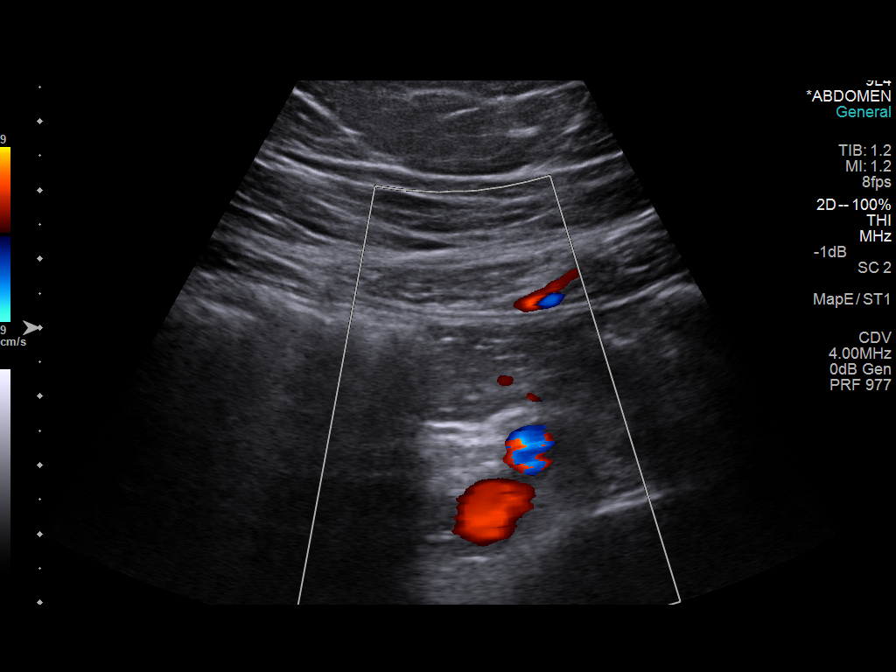
[im 3/8]
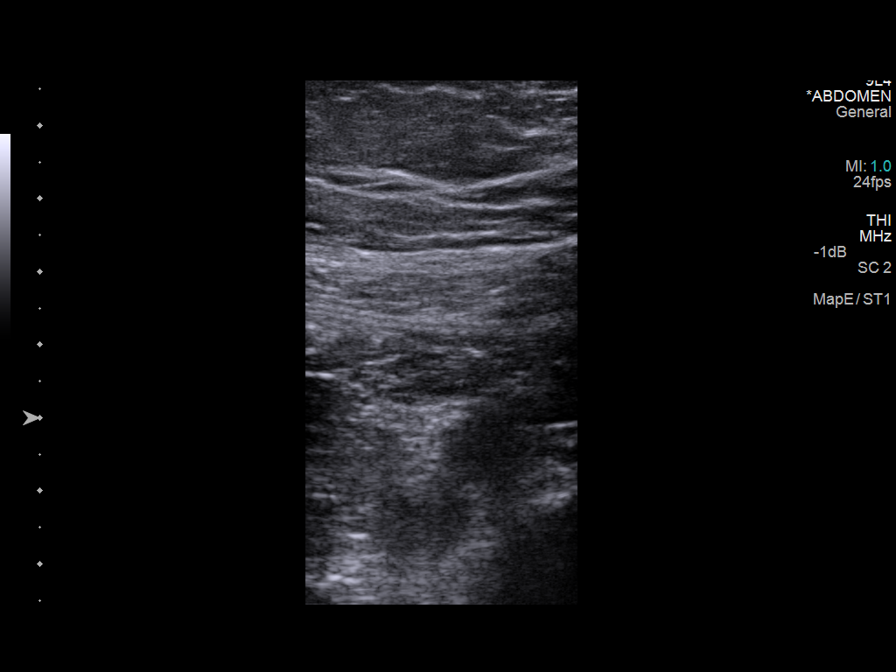
[im 4/8]
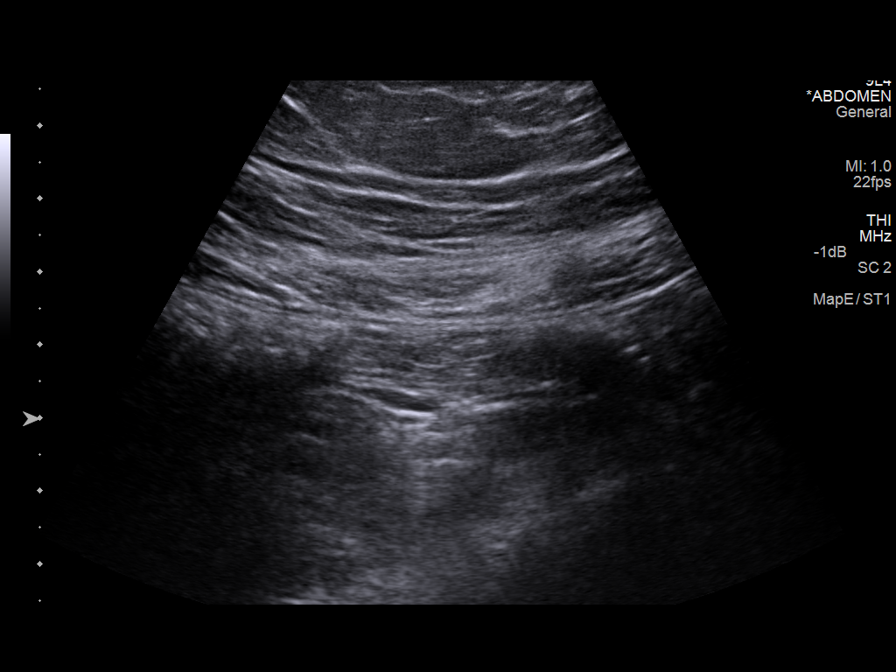
[im 5/8]
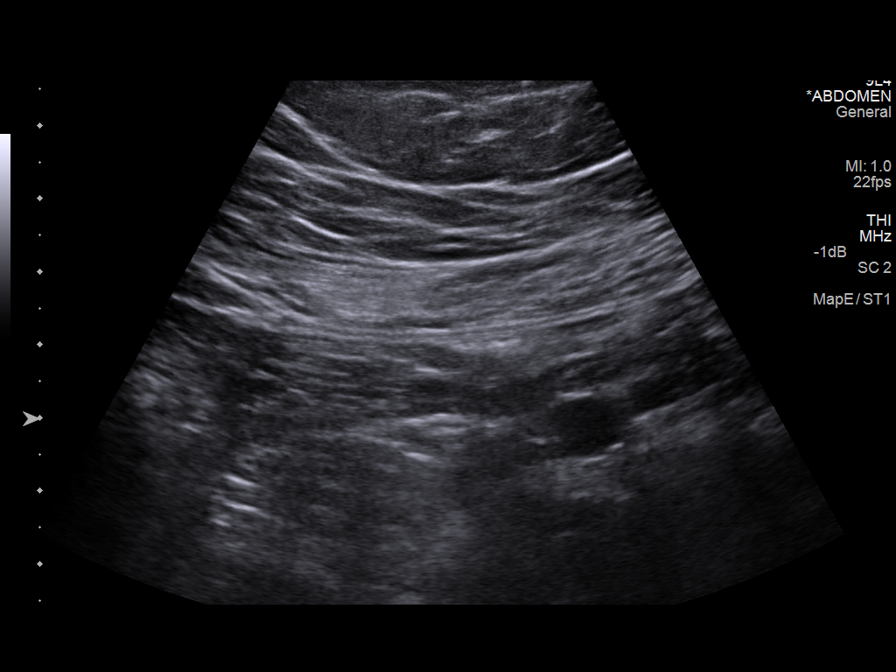
[im 6/8]
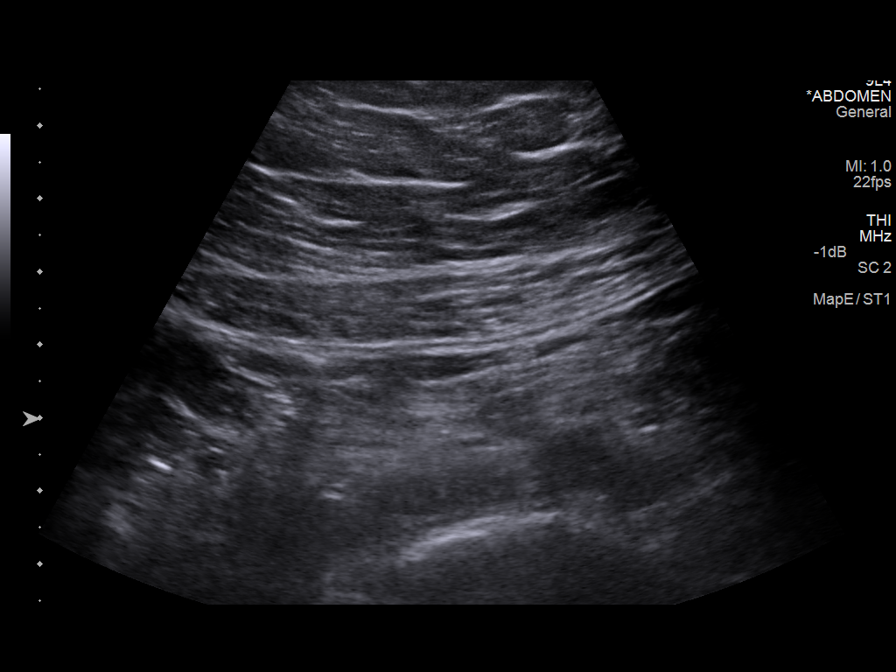
[im 7/8]
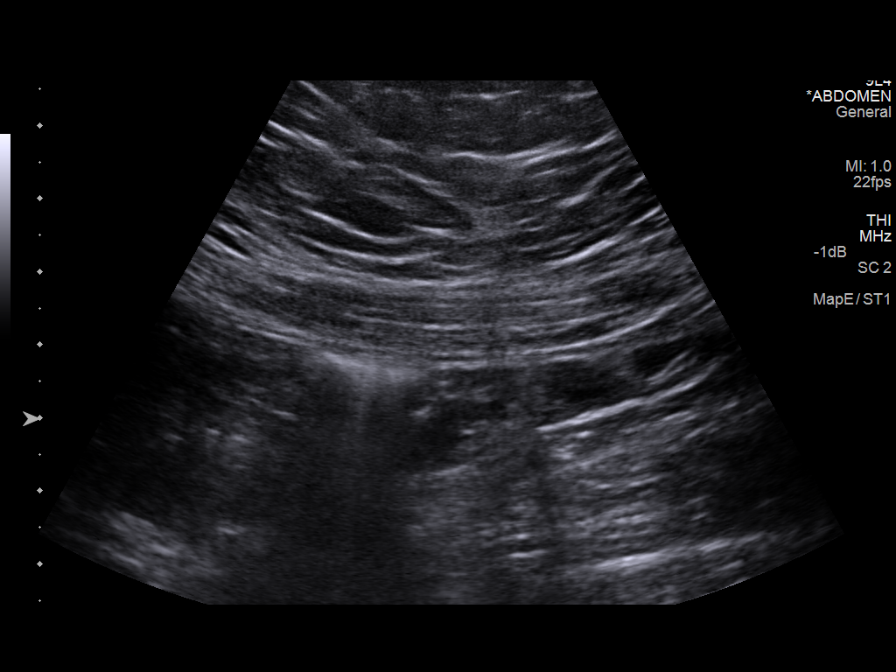
[im 8/8]
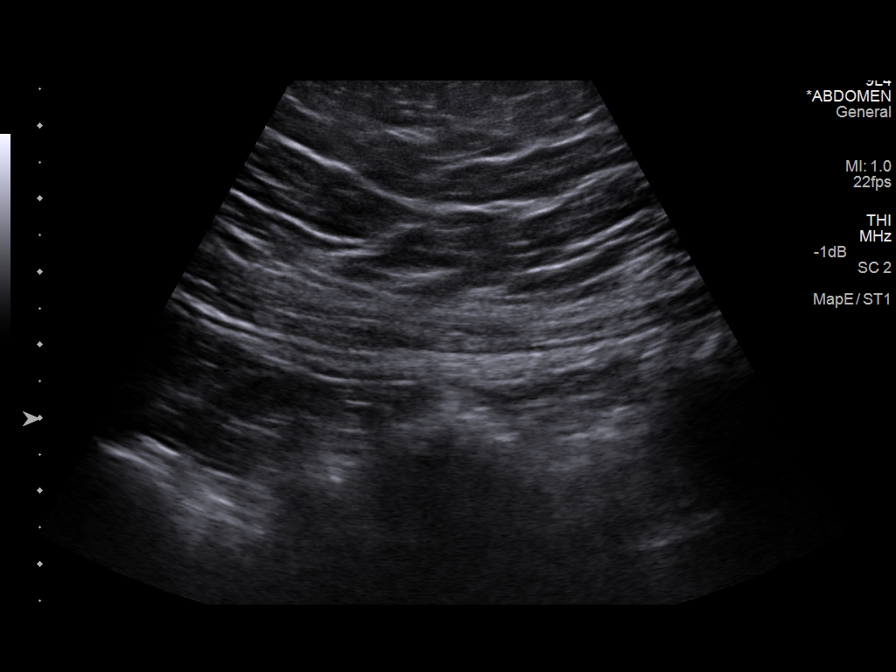

[8 of 8 positions shown; findings below may reference images not displayed]

FINDINGS: The appendix is not visualized.

Ancillary findings: None. No fluid collections or inflammatory
changes in the visualized field of view.

Factors affecting image quality: Body habitus related to current
pregnancy.
IMPRESSION: Appendix is not identified and therefore is indeterminate. No
specific evidence of infection is identified.

## 2016-05-12 IMAGING — US US OB COMP LESS 14 WK
1 series · 14 of 28 positions shown · non-contrast
Comparison: None.

CLINICAL DATA: Vaginal bleeding, RIGHT lower quadrant pain, first
trimester pregnancy. Gestational age by last menstrual period 6
weeks and 1 day.

EXAM:
OBSTETRIC <14 WK US AND TRANSVAGINAL OB US
TECHNIQUE: Both transabdominal and transvaginal ultrasound examinations were
performed for complete evaluation of the gestation as well as the
maternal uterus, adnexal regions, and pelvic cul-de-sac.
Transvaginal technique was performed to assess early pregnancy.

[Series 1: us ob comp less 14 wk · 0.18mm/px · 14 of 33 slices shown]
[im 2/33]
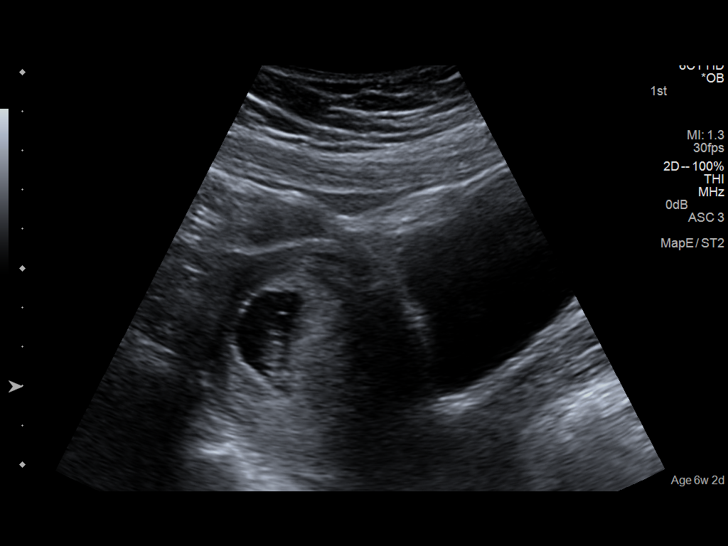
[im 4/33]
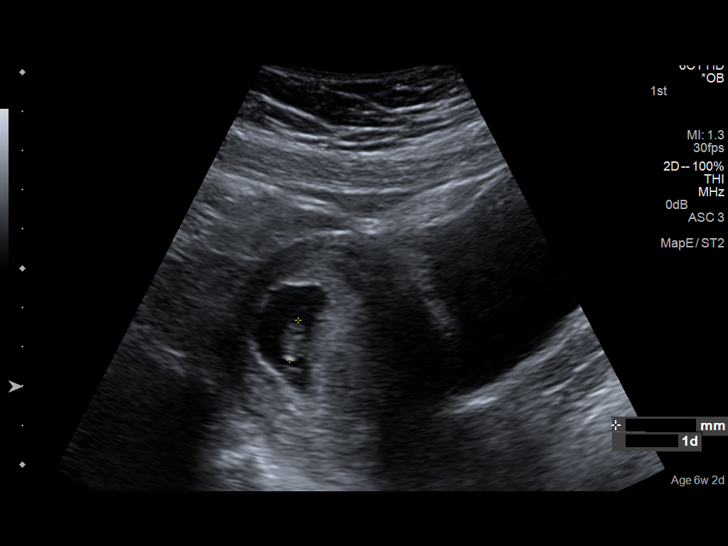
[im 6/33]
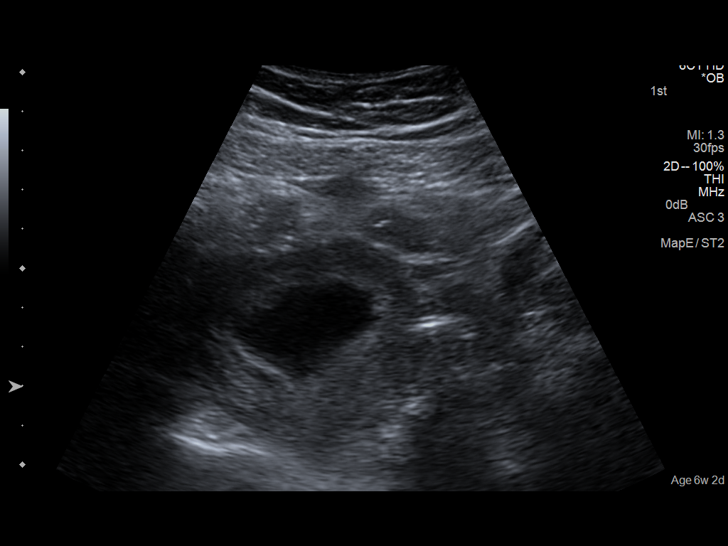
[im 9/33]
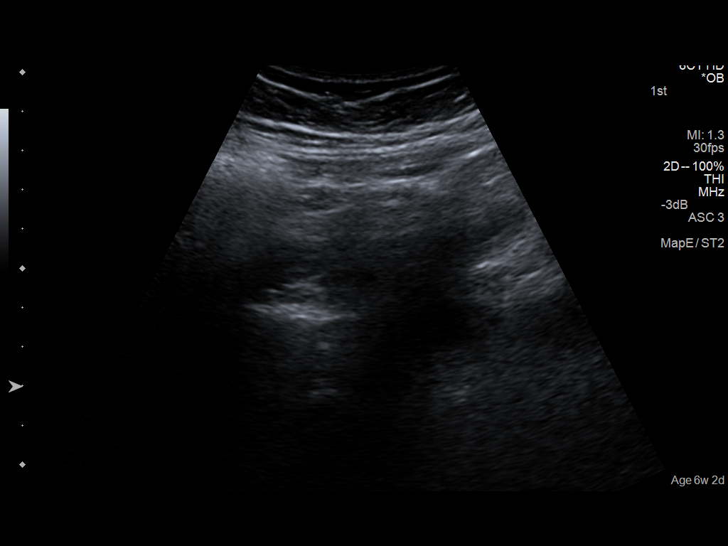
[im 11/33]
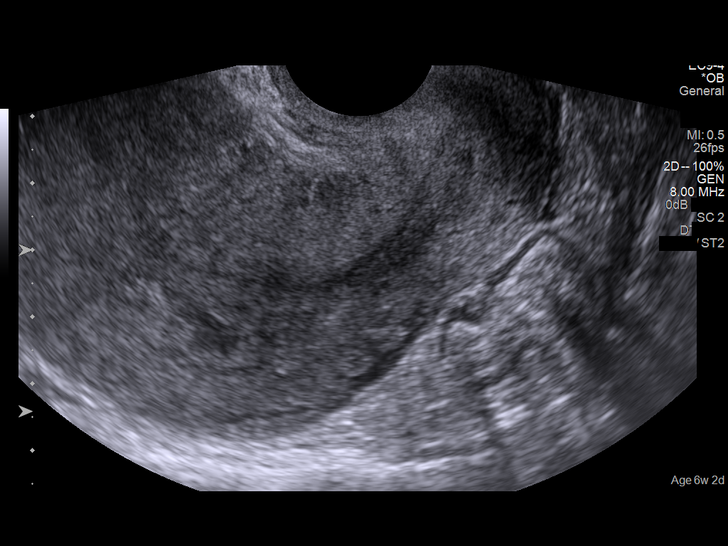
[im 14/33]
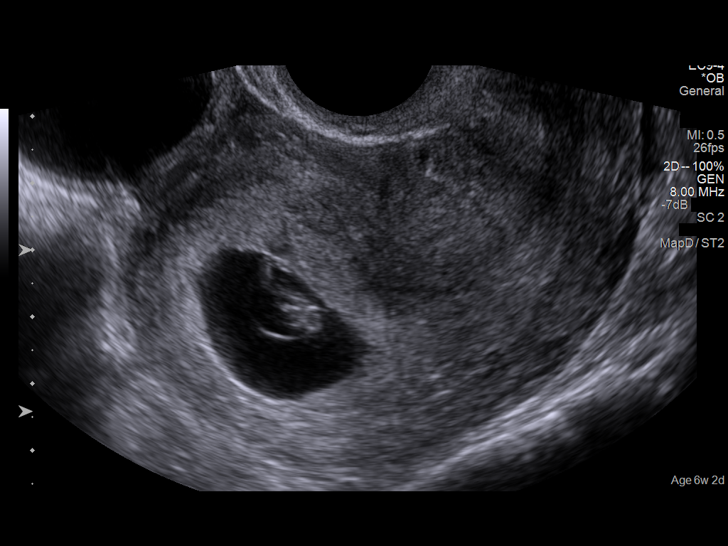
[im 16/33]
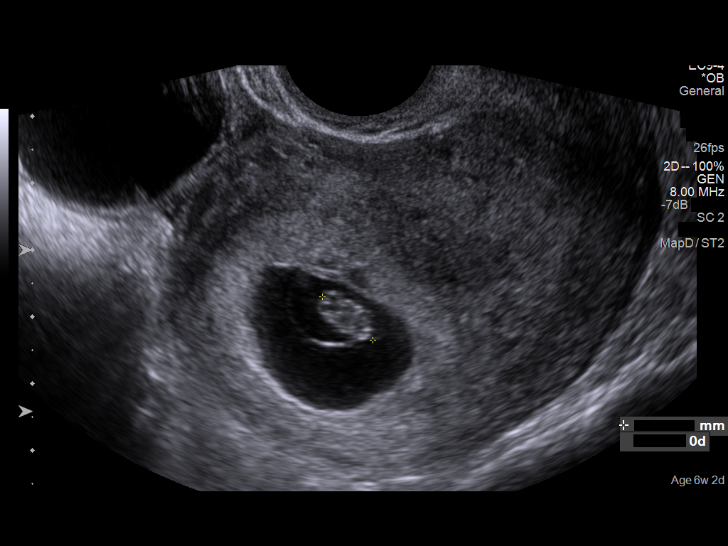
[im 18/33]
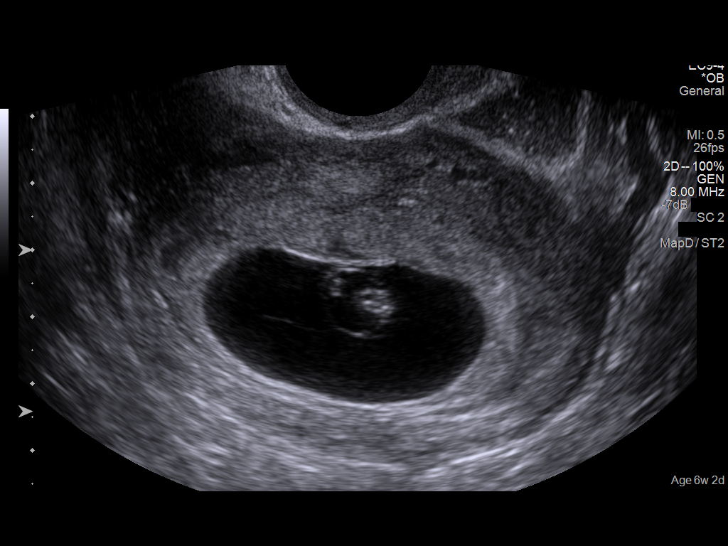
[im 21/33]
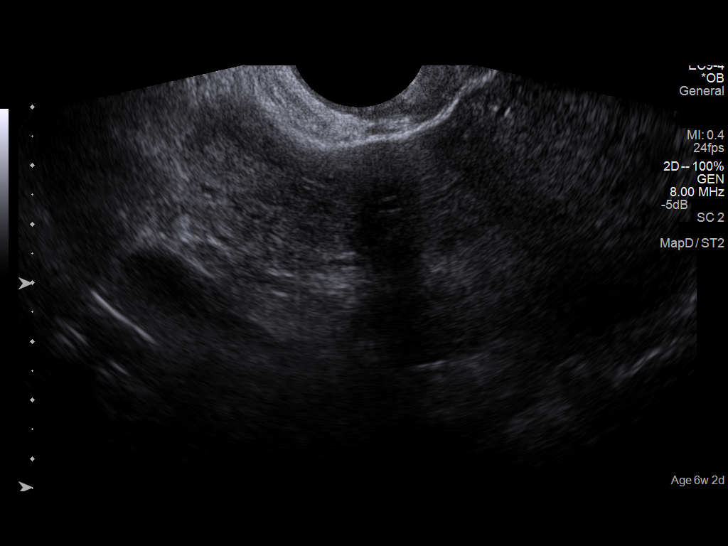
[im 23/33]
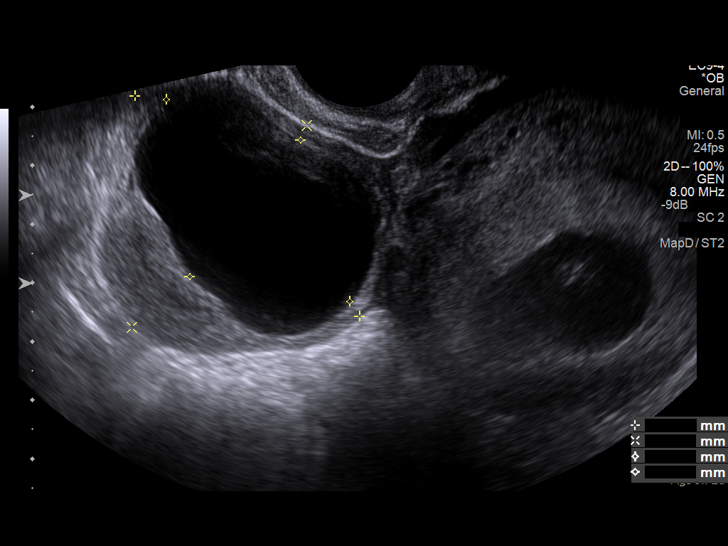
[im 25/33]
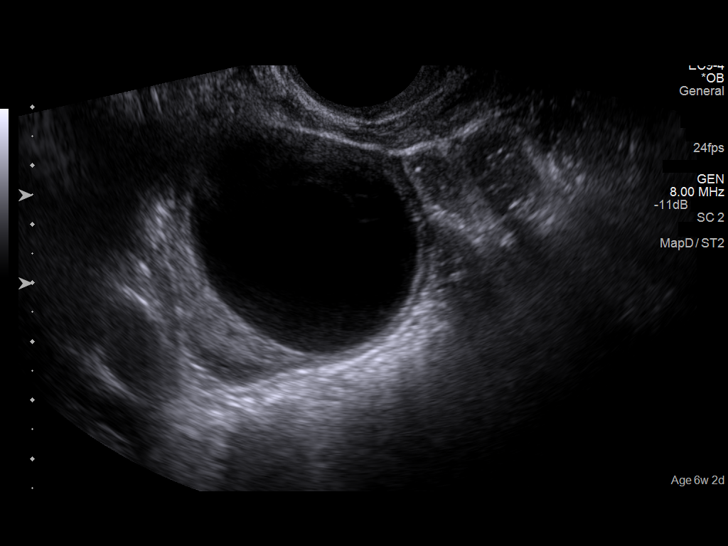
[im 28/33]
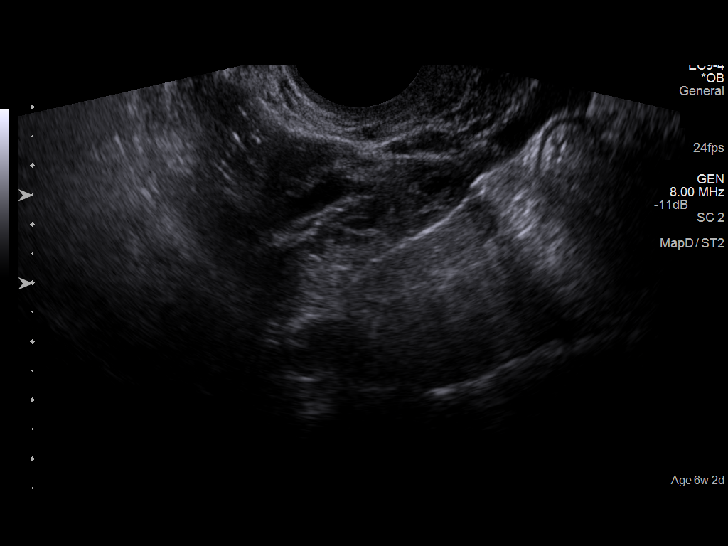
[im 30/33]
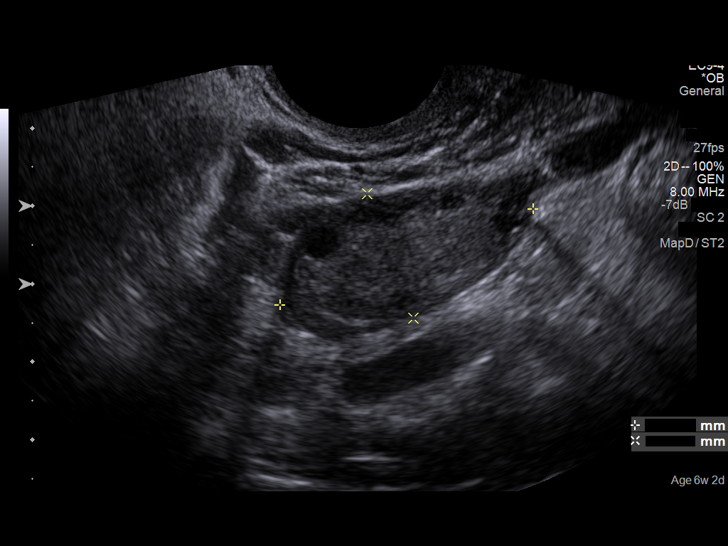
[im 33/33]
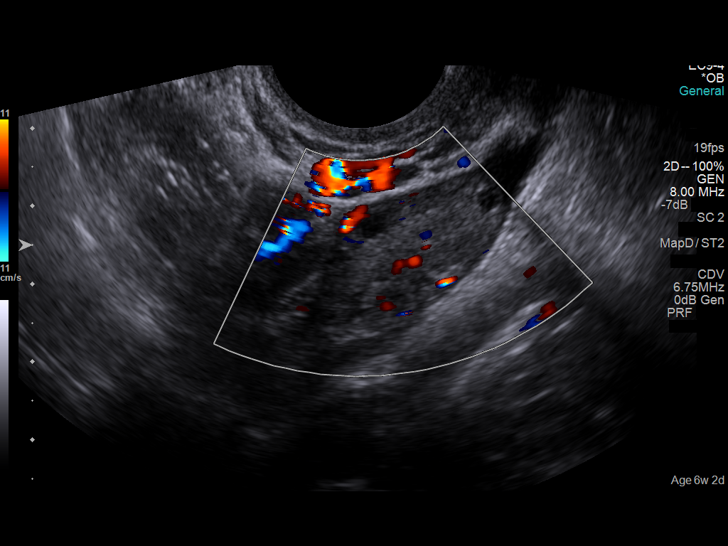

[14 of 28 positions shown; findings below may reference images not displayed]

FINDINGS: Intrauterine gestational sac: Visualized/normal in shape.

Yolk sac:  Present

Embryo:  Present

Cardiac Activity: Present

Heart Rate: 155  bpm

CRL: 10 .4 mm 7 w 1 d US EDC: October 10, 2015

Maternal uterus/adnexae: No subchorionic hemorrhage. 4.7 cm anechoic
RIGHT ovarian cyst with increased through transmission, no
vascularity. Normal appearance of the LEFT adnexae. No free fluid.
IMPRESSION: Single live intrauterine pregnancy, gestational age by ultrasound 7
weeks and 1 day without immediate complications.

4.7 cm RIGHT ovarian simple cyst.

## 2016-09-27 DIAGNOSIS — D2262 Melanocytic nevi of left upper limb, including shoulder: Secondary | ICD-10-CM | POA: Diagnosis not present

## 2016-09-27 DIAGNOSIS — L918 Other hypertrophic disorders of the skin: Secondary | ICD-10-CM | POA: Diagnosis not present

## 2017-12-25 DIAGNOSIS — Z0000 Encounter for general adult medical examination without abnormal findings: Secondary | ICD-10-CM | POA: Diagnosis not present

## 2017-12-25 LAB — HEPATITIS B SURFACE ANTIGEN: Hepatitis B Surface Ag: IMMUNE

## 2018-05-02 ENCOUNTER — Ambulatory Visit (INDEPENDENT_AMBULATORY_CARE_PROVIDER_SITE_OTHER): Payer: 59 | Admitting: Advanced Practice Midwife

## 2018-05-02 ENCOUNTER — Encounter: Payer: Self-pay | Admitting: Advanced Practice Midwife

## 2018-05-02 VITALS — BP 130/73 | HR 99 | Ht 65.0 in | Wt 184.5 lb

## 2018-05-02 DIAGNOSIS — Z30011 Encounter for initial prescription of contraceptive pills: Secondary | ICD-10-CM | POA: Diagnosis not present

## 2018-05-02 DIAGNOSIS — Z3202 Encounter for pregnancy test, result negative: Secondary | ICD-10-CM

## 2018-05-02 LAB — POCT URINE PREGNANCY: Preg Test, Ur: NEGATIVE

## 2018-05-02 MED ORDER — NORGESTIMATE-ETH ESTRADIOL 0.25-35 MG-MCG PO TABS: 1.0000 | ORAL_TABLET | Freq: Every day | ORAL | 11 refills | Status: DC

## 2018-05-02 NOTE — Patient Instructions (Signed)
Oral Contraception Information Oral contraceptive pills (OCPs) are medicines taken to prevent pregnancy. OCPs work by preventing the ovaries from releasing eggs. The hormones in OCPs also cause the cervical mucus to thicken, preventing the sperm from entering the uterus. The hormones also cause the uterine lining to become thin, not allowing a fertilized egg to attach to the inside of the uterus. OCPs are highly effective when taken exactly as prescribed. However, OCPs do not prevent sexually transmitted diseases (STDs). Safe sex practices, such as using condoms along with the pill, can help prevent STDs. Before taking the pill, you may have a physical exam and Pap test. Your health care provider may order blood tests. The health care provider will make sure you are a good candidate for oral contraception. Discuss with your health care provider the possible side effects of the OCP you may be prescribed. When starting an OCP, it can take 2 to 3 months for the body to adjust to the changes in hormone levels in your body. Types of oral contraception  The combination pill-This pill contains estrogen and progestin (synthetic progesterone) hormones. The combination pill comes in 21-day, 28-day, or 91-day packs. Some types of combination pills are meant to be taken continuously (365-day pills). With 21-day packs, you do not take pills for 7 days after the last pill. With 28-day packs, the pill is taken every day. The last 7 pills are without hormones. Certain types of pills have more than 21 hormone-containing pills. With 91-day packs, the first 84 pills contain both hormones, and the last 7 pills contain no hormones or contain estrogen only.  The minipill-This pill contains the progesterone hormone only. The pill is taken every day continuously. It is very important to take the pill at the same time each day. The minipill comes in packs of 28 pills. All 28 pills contain the hormone. Advantages of oral  contraceptive pills  Decreases premenstrual symptoms.  Treats menstrual period cramps.  Regulates the menstrual cycle.  Decreases a heavy menstrual flow.  May treatacne, depending on the type of pill.  Treats abnormal uterine bleeding.  Treats polycystic ovarian syndrome.  Treats endometriosis.  Can be used as emergency contraception. Things that can make oral contraceptive pills less effective OCPs can be less effective if:  You forget to take the pill at the same time every day.  You have a stomach or intestinal disease that lessens the absorption of the pill.  You take OCPs with other medicines that make OCPs less effective, such as antibiotics, certain HIV medicines, and some seizure medicines.  You take expired OCPs.  You forget to restart the pill on day 7, when using the packs of 21 pills.  Risks associated with oral contraceptive pills Oral contraceptive pills can sometimes cause side effects, such as:  Headache.  Nausea.  Breast tenderness.  Irregular bleeding or spotting.  Combination pills are also associated with a small increased risk of:  Blood clots.  Heart attack.  Stroke.  This information is not intended to replace advice given to you by your health care provider. Make sure you discuss any questions you have with your health care provider. Document Released: 10/21/2002 Document Revised: 01/06/2016 Document Reviewed: 01/19/2013 Elsevier Interactive Patient Education  2018 Elsevier Inc.  

## 2018-05-02 NOTE — Progress Notes (Signed)
[facility] Visit  Patient name: Lynn Hale MRN [id number]  Date of birth: [birth year]  CC & HPI:  Lynn Hale is a 27 y.o. Caucasian female presenting today for oral contraceptives. Took them about 7 years ago, wants to go back on them. Uses condoms now.  LMP 9/14.   Pertinent History Reviewed:  Medical & Surgical Hx:   Past Medical History:  Diagnosis Date  . Medical history non-contributory   . Pregnant 02/10/2015  . Vaginal bleeding in pregnancy 02/17/2015   Past Surgical History:  Procedure Laterality Date  . WISDOM TOOTH EXTRACTION     Family History  Problem Relation Age of Onset  . Cancer Mother        ovarian  . Diabetes Maternal Grandmother   . Heart disease Maternal Grandmother   . Glaucoma Paternal Grandmother   . Dementia Paternal Grandfather     Current Outpatient Medications:  .  norgestimate-ethinyl estradiol (ORTHO-CYCLEN,SPRINTEC,PREVIFEM) 0.25-35 MG-MCG tablet, Take 1 tablet by mouth daily., Disp: 1 Package, Rfl: 11 Social History: Reviewed -  reports that she has never smoked. She has never used smokeless tobacco.  Review of Systems:   Constitutional: Negative for fever and chills Eyes: Negative for visual disturbances Respiratory: Negative for shortness of breath, dyspnea Cardiovascular: Negative for chest pain or palpitations  Gastrointestinal: Negative for vomiting, diarrhea and constipation; no abdominal pain Genitourinary: Negative for dysuria and urgency, vaginal irritation or itching Musculoskeletal: Negative for back pain, joint pain, myalgias  Neurological: Negative for dizziness and headaches    Objective Findings:    Physical Examination: Vitals:   05/02/18 0951  BP: 130/73  Pulse: 99   General appearance - well appearing, and in no distress Mental status - alert, oriented to person, place, and time Chest:  Normal respiratory effort Heart - normal rate and regular rhythm Abdomen:  Soft, nontender Pelvic:  deferred Musculoskeletal:  Normal range of motion without pain Extremities:  No edema    Results for orders placed or performed in visit on 05/02/18 (from the past 24 hour(s))  POCT urine pregnancy   Collection Time: 05/02/18  9:59 AM  Result Value Ref Range   Preg Test, Ur Negative Negative      Assessment & Plan:  A:   Contraception management P:  Start sprintec today, Bu for 1st month   Return in about 3 months (around 08/01/2018) for pap and med check.  Joaquim Lai Cresenzo-Dishmon CNM 05/02/2018 10:10 AM

## 2018-07-24 ENCOUNTER — Encounter: Payer: Self-pay | Admitting: Advanced Practice Midwife

## 2018-07-24 ENCOUNTER — Other Ambulatory Visit (HOSPITAL_COMMUNITY)
Admission: RE | Admit: 2018-07-24 | Discharge: 2018-07-24 | Disposition: A | Discharge status: Home / Self Care | Payer: 59 | Source: Ambulatory Visit | Attending: Advanced Practice Midwife | Admitting: Advanced Practice Midwife

## 2018-07-24 ENCOUNTER — Ambulatory Visit (INDEPENDENT_AMBULATORY_CARE_PROVIDER_SITE_OTHER): Payer: 59 | Admitting: Advanced Practice Midwife

## 2018-07-24 VITALS — BP 126/79 | HR 90 | Ht 65.0 in | Wt 180.4 lb

## 2018-07-24 DIAGNOSIS — Z01419 Encounter for gynecological examination (general) (routine) without abnormal findings: Secondary | ICD-10-CM | POA: Diagnosis not present

## 2018-07-24 LAB — HM PAP SMEAR

## 2018-07-24 NOTE — Addendum Note (Signed)
Addended by: Diona Fanti A on: 07/24/2018 09:25 AM   Modules accepted: Orders

## 2018-07-24 NOTE — Progress Notes (Signed)
Lynn Hale 27 y.o.  Vitals:   07/24/18 0835  BP: 126/79  Pulse: 90     Filed Weights   07/24/18 0835  Weight: 180 lb 6.4 oz (81.8 kg)    Past Medical History: Past Medical History:  Diagnosis Date  . Medical history non-contributory   . Pregnant 02/10/2015  . Vaginal bleeding in pregnancy 02/17/2015    Past Surgical History: Past Surgical History:  Procedure Laterality Date  . WISDOM TOOTH EXTRACTION      Family History: Family History  Problem Relation Age of Onset  . Cancer Mother        ovarian  . Diabetes Maternal Grandmother   . Heart disease Maternal Grandmother   . Glaucoma Paternal Grandmother   . Dementia Paternal Grandfather     Social History: Social History   Tobacco Use  . Smoking status: Never Smoker  . Smokeless tobacco: Never Used  Substance Use Topics  . Alcohol use: Yes    Comment: occ  . Drug use: No    Allergies: No Known Allergies    Current Outpatient Medications:  .  norgestimate-ethinyl estradiol (ORTHO-CYCLEN,SPRINTEC,PREVIFEM) 0.25-35 MG-MCG tablet, Take 1 tablet by mouth daily., Disp: 1 Package, Rfl: 11  History of Present Illness: Here for pap and physical. Last pap 4-5 years ago (maybe) at [facility], pt not sure.  ;  Started Sprintec COCs in September.  Doing fine, now used to taking them every day. No BTB.  Has had HA for years, may want to see specialist. .    Review of Systems   Patient denies any headaches, blurred vision, shortness of breath, chest pain, abdominal pain, problems with bowel movements, urination, or intercourse.   Physical Exam: General:  Well developed, well nourished, no acute distress Skin:  Warm and dry Neck:  Midline trachea, normal thyroid Lungs; Clear to auscultation bilaterally Breast:  No dominant palpable mass, retraction, or nipple discharge Cardiovascular: Regular rate and rhythm Abdomen:  Soft, non tender, no hepatosplenomegaly Pelvic:  External genitalia is normal in appearance.   The vagina is normal in appearance.  The cervix is bulbous.  Uterus is felt to be normal size, shape, and contour.  No adnexal masses or tenderness noted.  Extremities:  No swelling or varicosities noted Psych:  No mood changes.     Impression: normal GYN exam Contraception mgt     Plan: if pap normal, repeat in 3 years Continue [place] for HAs if desires.

## 2018-07-24 NOTE — Patient Instructions (Signed)
[facility] in [place]

## 2018-07-29 LAB — CYTOLOGY - PAP

## 2018-07-30 ENCOUNTER — Encounter: Payer: Self-pay | Admitting: Advanced Practice Midwife

## 2018-07-30 DIAGNOSIS — R87612 Low grade squamous intraepithelial lesion on cytologic smear of cervix (LGSIL): Secondary | ICD-10-CM | POA: Insufficient documentation

## 2018-08-01 ENCOUNTER — Other Ambulatory Visit: Payer: 59 | Admitting: Advanced Practice Midwife

## 2018-08-13 ENCOUNTER — Telehealth: Payer: Self-pay | Admitting: *Deleted

## 2018-08-13 NOTE — Telephone Encounter (Signed)
LMOVM to return my call regarding pap smear results.

## 2018-09-06 ENCOUNTER — Encounter: Payer: Self-pay | Admitting: Obstetrics & Gynecology

## 2018-09-06 ENCOUNTER — Other Ambulatory Visit: Payer: Self-pay | Admitting: Obstetrics & Gynecology

## 2018-09-06 ENCOUNTER — Ambulatory Visit: Payer: 59 | Admitting: Obstetrics & Gynecology

## 2018-09-06 VITALS — BP 113/69 | HR 83 | Ht 65.0 in | Wt 181.0 lb

## 2018-09-06 DIAGNOSIS — N871 Moderate cervical dysplasia: Secondary | ICD-10-CM

## 2018-09-06 DIAGNOSIS — Z3202 Encounter for pregnancy test, result negative: Secondary | ICD-10-CM | POA: Diagnosis not present

## 2018-09-06 DIAGNOSIS — R87612 Low grade squamous intraepithelial lesion on cytologic smear of cervix (LGSIL): Secondary | ICD-10-CM

## 2018-09-06 LAB — POCT URINE PREGNANCY: PREG TEST UR: NEGATIVE

## 2018-09-06 NOTE — Addendum Note (Signed)
Addended by: Linton Rump on: 09/06/2018 10:07 AM   Modules accepted: Orders

## 2018-09-06 NOTE — Progress Notes (Signed)
Colposcopy Procedure Note:  Colposcopy Procedure Note  Indications: Pap smear 1 months ago showed: low-grade squamous intraepithelial neoplasia (LGSIL - encompassing HPV,mild dysplasia,CIN I). The prior pap showed no abnormalities.  Prior cervical/vaginal disease: normal exam without visible pathology. Prior cervical treatment: no treatment.  Smoker:  No. New sexual partner:  No.    History of abnormal Pap: no  Procedure Details  The risks and benefits of the procedure and Written informed consent obtained.  Speculum placed in vagina and excellent visualization of cervix achieved, cervix swabbed x 3 with acetic acid solution.  Findings: Cervix: unusual pattern of acetowhite epithelium in a patient with wide extension of the columnar epithelium, some punctation as well no mosaicism; SCJ visualized - lesion at 5 o'clock. Vaginal inspection: normal without visible lesions. Vulvar colposcopy: vulvar colposcopy not performed.  Specimens: cervical biopsy @5  o'clock  Complications: none.   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 Return 1 week for results Monsel's placed   Return if symptoms worsen or fail to improve, for will call or send MyChart message with results.

## 2018-09-23 ENCOUNTER — Encounter: Payer: Self-pay | Admitting: Obstetrics & Gynecology

## 2018-09-23 ENCOUNTER — Ambulatory Visit: Payer: 59 | Admitting: Obstetrics & Gynecology

## 2018-09-23 ENCOUNTER — Other Ambulatory Visit: Payer: Self-pay

## 2018-09-23 VITALS — BP 130/74 | HR 99 | Ht 64.0 in | Wt 184.0 lb

## 2018-09-23 DIAGNOSIS — Z01818 Encounter for other preprocedural examination: Secondary | ICD-10-CM | POA: Diagnosis not present

## 2018-09-23 DIAGNOSIS — R87613 High grade squamous intraepithelial lesion on cytologic smear of cervix (HGSIL): Secondary | ICD-10-CM | POA: Diagnosis not present

## 2018-09-23 NOTE — Progress Notes (Signed)
Preoperative History and Physical  Lynn Hale is a 28 y.o. G1P1001 with Patient's last menstrual period was 09/09/2018. admitted for a laser ablation of the cervix.  Cervical biopsy HSIL very wide extension  PMH:    Past Medical History:  Diagnosis Date  . Medical history non-contributory   . Pregnant 02/10/2015  . Vaginal bleeding in pregnancy 02/17/2015  . Vaginal Pap smear, abnormal     PSH:     Past Surgical History:  Procedure Laterality Date  . WISDOM TOOTH EXTRACTION      POb/GynH:      OB History    Gravida  1   Para  1   Term  1   Preterm      AB      Living  1     SAB      TAB      Ectopic      Multiple  0   Live Births  1           SH:   Social History   Tobacco Use  . Smoking status: Never Smoker  . Smokeless tobacco: Never Used  Substance Use Topics  . Alcohol use: Yes    Comment: occ  . Drug use: No    FH:    Family History  Problem Relation Age of Onset  . Cancer Mother        cervical  . Diabetes Maternal Grandmother   . Heart disease Maternal Grandmother   . Glaucoma Paternal Grandmother   . Dementia Paternal Grandfather      Allergies: No Known Allergies  Medications:       Current Outpatient Medications:  .  norgestimate-ethinyl estradiol (ORTHO-CYCLEN,SPRINTEC,PREVIFEM) 0.25-35 MG-MCG tablet, Take 1 tablet by mouth daily., Disp: 1 Package, Rfl: 11  Review of Systems:   Review of Systems  Constitutional: Negative for fever, chills, weight loss, malaise/fatigue and diaphoresis.  HENT: Negative for hearing loss, ear pain, nosebleeds, congestion, sore throat, neck pain, tinnitus and ear discharge.   Eyes: Negative for blurred vision, double vision, photophobia, pain, discharge and redness.  Respiratory: Negative for cough, hemoptysis, sputum production, shortness of breath, wheezing and stridor.   Cardiovascular: Negative for chest pain, palpitations, orthopnea, claudication, leg swelling and PND.   Gastrointestinal: Positive for abdominal pain. Negative for heartburn, nausea, vomiting, diarrhea, constipation, blood in stool and melena.  Genitourinary: Negative for dysuria, urgency, frequency, hematuria and flank pain.  Musculoskeletal: Negative for myalgias, back pain, joint pain and falls.  Skin: Negative for itching and rash.  Neurological: Negative for dizziness, tingling, tremors, sensory change, speech change, focal weakness, seizures, loss of consciousness, weakness and headaches.  Endo/Heme/Allergies: Negative for environmental allergies and polydipsia. Does not bruise/bleed easily.  Psychiatric/Behavioral: Negative for depression, suicidal ideas, hallucinations, memory loss and substance abuse. The patient is not nervous/anxious and does not have insomnia.      PHYSICAL EXAM:  Blood pressure 130/74, pulse 99, height 5\' 4"  (1.626 m), weight 184 lb (83.5 kg), last menstrual period 09/09/2018.    Vitals reviewed. Constitutional: She is oriented to person, place, and time. She appears well-developed and well-nourished.  HENT:  Head: Normocephalic and atraumatic.  Right Ear: External ear normal.  Left Ear: External ear normal.  Nose: Nose normal.  Mouth/Throat: Oropharynx is clear and moist.  Eyes: Conjunctivae and EOM are normal. Pupils are equal, round, and reactive to light. Right eye exhibits no discharge. Left eye exhibits no discharge. No scleral icterus.  Neck: Normal range of  motion. Neck supple. No tracheal deviation present. No thyromegaly present.  Cardiovascular: Normal rate, regular rhythm, normal heart sounds and intact distal pulses.  Exam reveals no gallop and no friction rub.   No murmur heard. Respiratory: Effort normal and breath sounds normal. No respiratory distress. She has no wheezes. She has no rales. She exhibits no tenderness.  GI: Soft. Bowel sounds are normal. She exhibits no distension and no mass. There is tenderness. There is no rebound and no  guarding.  Genitourinary:       Vulva is normal without lesions Vagina is pink moist without discharge Cervix see colposcopy Uterus is normal size, contour, position, consistency, mobility, non-tender Adnexa is negative with normal sized ovaries by sonogram  Musculoskeletal: Normal range of motion. She exhibits no edema and no tenderness.  Neurological: She is alert and oriented to person, place, and time. She has ormal reflexes. She displays normal reflexes. No cranial nerve deficit. She exhibits normal muscle tone. Coordination normal.  Skin: Skin is warm and dry. No rash noted. No erythema. No pallor.  Psychiatric: She has a normal mood and affect. Her behavior is normal. Judgment and thought content normal.    Labs: No results found for this or any previous visit (from the past 336 hour(s)).  EKG: No orders found for this or any previous visit.  Imaging Studies: No results found.    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

## 2018-10-17 NOTE — Patient Instructions (Signed)
Lynn Hale  10/17/2018     @PREFPERIOPPHARMACY @   Your procedure is scheduled on  10/25/2018  Report to [facility] at  730  A.M.  Call this number if you have problems the morning of surgery:  [phone]   Remember:  Do not eat or drink after midnight.                       Take these medicines the morning of surgery with A SIP OF WATER  None    Do not wear jewelry, make-up or nail polish.  Do not wear lotions, powders, or perfumes, or deodorant.  Do not shave 48 hours prior to surgery.  Men may shave face and neck.  Do not bring valuables to the hospital.  [facility] is not responsible for any belongings or valuables.  Contacts, dentures or bridgework may not be worn into surgery.  Leave your suitcase in the car.  After surgery it may be brought to your room.  For patients admitted to the hospital, discharge time will be determined by your treatment team.  Patients discharged the day of surgery will not be allowed to drive home.   Name and phone number of your driver:   family Special instructions:  None  Please read over the following fact sheets that you were given. Anesthesia Post-op Instructions and Care and Recovery After Surgery       Cervical Laser Surgery, Care After  This sheet gives you information about how to care for yourself after your procedure. Your health care provider may also give you more specific instructions. If you have problems or questions, contact your health care provider. What can I expect after the procedure? After the procedure, it is common to have:  Pain or discomfort.  Mild cramping.  Bleeding, spotting, or brownish discharge from your vagina. Follow these instructions at home: Activity  Return to your normal activities as told by your health care provider. Ask your health care provider what activities are safe for you.  Do not lift anything that is heavier than 10 lb (4.5 kg), or the limit that your health  care provider tells you, until he or she says that it is safe.  Do not have sex or put anything in your vagina until your health care provider says it is okay. General instructions  Take over-the-counter and prescription medicines only as told by your health care provider.  Do not drive or use heavy machinery while taking prescription pain medicine.  Wear sanitary pads to protect from bleeding, spotting, and discharge.  Do not use tampons or douche until your health care provider says it is okay.  It is up to you to get the results of your procedure. Ask your health care provider, or the department that is doing the procedure, when your results will be ready.  Keep all follow-up visits as told by your health care provider. This is important. Contact a health care provider if:  Your pain or cramping does not improve.  Your periods are more painful than usual.  You do not get your period as expected. Get help right away if:  You have any symptoms of infection, such as: ? A fever. ? Chills. ? Discharge that smells bad.  You have severe pain in your abdomen.  You have heavy bleeding from your vagina (more than a normal period).  You have vaginal bleeding with clumps of blood (blood clots). Summary  After this procedure, it is common to have pain or discomfort and mild cramping. It is also common to have bleeding, spotting, or brownish discharge from your vagina.  You may need to wear sanitary pads to protect from bleeding, spotting, and discharge.  Do not have sex, use tampons, or douche until your health care provider says it is okay.  Return to your normal activities as told by your health care provider. Ask your health care provider what activities are safe for you.  Take over-the-counter and prescription medicines only as told by your health care provider. These include medicines for pain. This information is not intended to replace advice given to you by your health  care provider. Make sure you discuss any questions you have with your health care provider. Document Released: 06/19/2016 Document Revised: 06/19/2016 Document Reviewed: 06/19/2016 Elsevier Interactive Patient Education  2019 [place] Anesthesia, Adult, Care After This sheet gives you information about how to care for yourself after your procedure. Your health care provider may also give you more specific instructions. If you have problems or questions, contact your health care provider. What can I expect after the procedure? After the procedure, the following side effects are common:  Pain or discomfort at the IV site.  Nausea.  Vomiting.  Sore throat.  Trouble concentrating.  Feeling cold or chills.  Weak or tired.  Sleepiness and fatigue.  Soreness and body aches. These side effects can affect parts of the body that were not involved in surgery. Follow these instructions at home:  For at least 24 hours after the procedure:  Have a responsible adult stay with you. It is important to have someone help care for you until you are awake and alert.  Rest as needed.  Do not: ? Participate in activities in which you could fall or become injured. ? Drive. ? Use heavy machinery. ? Drink alcohol. ? Take sleeping pills or medicines that cause drowsiness. ? Make important decisions or sign legal documents. ? Take care of children on your own. Eating and drinking  Follow any instructions from your health care provider about eating or drinking restrictions.  When you feel hungry, start by eating small amounts of foods that are soft and easy to digest (bland), such as toast. Gradually return to your regular diet.  Drink enough fluid to keep your urine pale yellow.  If you vomit, rehydrate by drinking water, juice, or clear broth. General instructions  If you have sleep apnea, surgery and certain medicines can increase your risk for breathing problems. Follow  instructions from your health care provider about wearing your sleep device: ? Anytime you are sleeping, including during daytime naps. ? While taking prescription pain medicines, sleeping medicines, or medicines that make you drowsy.  Return to your normal activities as told by your health care provider. Ask your health care provider what activities are safe for you.  Take over-the-counter and prescription medicines only as told by your health care provider.  If you smoke, do not smoke without supervision.  Keep all follow-up visits as told by your health care provider. This is important. Contact a health care provider if:  You have nausea or vomiting that does not get better with medicine.  You cannot eat or drink without vomiting.  You have pain that does not get better with medicine.  You are unable to pass urine.  You develop a skin rash.  You have a fever.  You have redness around your IV site that  gets worse. Get help right away if:  You have difficulty breathing.  You have chest pain.  You have blood in your urine or stool, or you vomit blood. Summary  After the procedure, it is common to have a sore throat or nausea. It is also common to feel tired.  Have a responsible adult stay with you for the first 24 hours after general anesthesia. It is important to have someone help care for you until you are awake and alert.  When you feel hungry, start by eating small amounts of foods that are soft and easy to digest (bland), such as toast. Gradually return to your regular diet.  Drink enough fluid to keep your urine pale yellow.  Return to your normal activities as told by your health care provider. Ask your health care provider what activities are safe for you. This information is not intended to replace advice given to you by your health care provider. Make sure you discuss any questions you have with your health care provider. Document Released: 11/06/2000 Document  Revised: 03/16/2017 Document Reviewed: 03/16/2017 Elsevier Interactive Patient Education  2019 [employer].

## 2018-10-21 ENCOUNTER — Encounter (HOSPITAL_COMMUNITY): Payer: Self-pay

## 2018-10-21 ENCOUNTER — Other Ambulatory Visit (HOSPITAL_COMMUNITY): Payer: 59

## 2018-10-21 ENCOUNTER — Other Ambulatory Visit: Payer: Self-pay

## 2018-10-21 ENCOUNTER — Encounter (HOSPITAL_COMMUNITY)
Admission: RE | Admit: 2018-10-21 | Discharge: 2018-10-21 | Disposition: A | Discharge status: Home / Self Care | Payer: 59 | Source: Ambulatory Visit | Attending: Obstetrics & Gynecology | Admitting: Obstetrics & Gynecology

## 2018-10-21 DIAGNOSIS — Z01812 Encounter for preprocedural laboratory examination: Secondary | ICD-10-CM | POA: Diagnosis not present

## 2018-10-21 LAB — URINALYSIS, ROUTINE W REFLEX MICROSCOPIC
Bilirubin Urine: NEGATIVE
Glucose, UA: NEGATIVE mg/dL
Ketones, ur: NEGATIVE mg/dL
Nitrite: NEGATIVE
Protein, ur: NEGATIVE mg/dL
SPECIFIC GRAVITY, URINE: 1.018 (ref 1.005–1.030)
pH: 5 (ref 5.0–8.0)

## 2018-10-21 LAB — CBC
HCT: 43.1 % (ref 36.0–46.0)
Hemoglobin: 14.1 g/dL (ref 12.0–15.0)
MCH: 30.4 pg (ref 26.0–34.0)
MCHC: 32.7 g/dL (ref 30.0–36.0)
MCV: 92.9 fL (ref 80.0–100.0)
PLATELETS: 328 10*3/uL (ref 150–400)
RBC: 4.64 MIL/uL (ref 3.87–5.11)
RDW: 11.7 % (ref 11.5–15.5)
WBC: 7.3 10*3/uL (ref 4.0–10.5)
nRBC: 0 % (ref 0.0–0.2)

## 2018-10-21 LAB — COMPREHENSIVE METABOLIC PANEL
ALBUMIN: 4.1 g/dL (ref 3.5–5.0)
ALT: 10 U/L (ref 0–44)
AST: 13 U/L — ABNORMAL LOW (ref 15–41)
Alkaline Phosphatase: 60 U/L (ref 38–126)
Anion gap: 9 (ref 5–15)
BUN: 14 mg/dL (ref 6–20)
CHLORIDE: 103 mmol/L (ref 98–111)
CO2: 25 mmol/L (ref 22–32)
Calcium: 9.2 mg/dL (ref 8.9–10.3)
Creatinine, Ser: 0.65 mg/dL (ref 0.44–1.00)
GFR calc Af Amer: >60 mL/min (ref >60)
GFR calc non Af Amer: >60 mL/min (ref >60)
GLUCOSE: 105 mg/dL — AB (ref 70–99)
Potassium: 3.7 mmol/L (ref 3.5–5.1)
SODIUM: 137 mmol/L (ref 135–145)
TOTAL PROTEIN: 7.7 g/dL (ref 6.5–8.1)
Total Bilirubin: 0.5 mg/dL (ref 0.3–1.2)

## 2018-10-21 LAB — RAPID HIV SCREEN (HIV 1/2 AB+AG)
HIV 1/2 Antibodies: NONREACTIVE
HIV-1 P24 Antigen - HIV24: NONREACTIVE

## 2018-10-21 LAB — HCG, QUANTITATIVE, PREGNANCY

## 2018-10-24 NOTE — H&P (Signed)
Show:Clear all [x] Manual[x] Template[] Copied  Added by: [x] Florian Buff, MD  [] Hover for details Preoperative History and Physical  Lynn Hale is a 28 y.o. G1P1001 with Patient's last menstrual period was 09/09/2018. admitted for a laser ablation of the cervix.  Cervical biopsy HSIL very wide extension  PMH:        Past Medical History:  Diagnosis Date  . Medical history non-contributory   . Pregnant 02/10/2015  . Vaginal bleeding in pregnancy 02/17/2015  . Vaginal Pap smear, abnormal     PSH:          Past Surgical History:  Procedure Laterality Date  . WISDOM TOOTH EXTRACTION      POb/GynH:              OB History    Gravida  1   Para  1   Term  1   Preterm      AB      Living  1     SAB      TAB      Ectopic      Multiple  0   Live Births  1           SH:   Social History        Tobacco Use  . Smoking status: Never Smoker  . Smokeless tobacco: Never Used  Substance Use Topics  . Alcohol use: Yes    Comment: occ  . Drug use: No    FH:         Family History  Problem Relation Age of Onset  . Cancer Mother        cervical  . Diabetes Maternal Grandmother   . Heart disease Maternal Grandmother   . Glaucoma Paternal Grandmother   . Dementia Paternal Grandfather      Allergies: No Known Allergies  Medications:       Current Outpatient Medications:  .  norgestimate-ethinyl estradiol (ORTHO-CYCLEN,SPRINTEC,PREVIFEM) 0.25-35 MG-MCG tablet, Take 1 tablet by mouth daily., Disp: 1 Package, Rfl: 11  Review of Systems:   Review of Systems  Constitutional: Negative for fever, chills, weight loss, malaise/fatigue and diaphoresis.  HENT: Negative for hearing loss, ear pain, nosebleeds, congestion, sore throat, neck pain, tinnitus and ear discharge.   Eyes: Negative for blurred vision, double vision, photophobia, pain, discharge and redness.  Respiratory: Negative for cough, hemoptysis,  sputum production, shortness of breath, wheezing and stridor.   Cardiovascular: Negative for chest pain, palpitations, orthopnea, claudication, leg swelling and PND.  Gastrointestinal: Positive for abdominal pain. Negative for heartburn, nausea, vomiting, diarrhea, constipation, blood in stool and melena.  Genitourinary: Negative for dysuria, urgency, frequency, hematuria and flank pain.  Musculoskeletal: Negative for myalgias, back pain, joint pain and falls.  Skin: Negative for itching and rash.  Neurological: Negative for dizziness, tingling, tremors, sensory change, speech change, focal weakness, seizures, loss of consciousness, weakness and headaches.  Endo/Heme/Allergies: Negative for environmental allergies and polydipsia. Does not bruise/bleed easily.  Psychiatric/Behavioral: Negative for depression, suicidal ideas, hallucinations, memory loss and substance abuse. The patient is not nervous/anxious and does not have insomnia.      PHYSICAL EXAM:  Blood pressure 130/74, pulse 99, height 5\' 4"  (1.626 m), weight 184 lb (83.5 kg), last menstrual period 09/09/2018.    Vitals reviewed. Constitutional: She is oriented to person, place, and time. She appears well-developed and well-nourished.  HENT:  Head: Normocephalic and atraumatic.  Right Ear: External ear normal.  Left Ear: External ear normal.  Nose:  Nose normal.  Mouth/Throat: Oropharynx is clear and moist.  Eyes: Conjunctivae and EOM are normal. Pupils are equal, round, and reactive to light. Right eye exhibits no discharge. Left eye exhibits no discharge. No scleral icterus.  Neck: Normal range of motion. Neck supple. No tracheal deviation present. No thyromegaly present.  Cardiovascular: Normal rate, regular rhythm, normal heart sounds and intact distal pulses.  Exam reveals no gallop and no friction rub.   No murmur heard. Respiratory: Effort normal and breath sounds normal. No respiratory distress. She has no wheezes.  She has no rales. She exhibits no tenderness.  GI: Soft. Bowel sounds are normal. She exhibits no distension and no mass. There is tenderness. There is no rebound and no guarding.  Genitourinary:       Vulva is normal without lesions Vagina is pink moist without discharge Cervix see colposcopy Uterus is normal size, contour, position, consistency, mobility, non-tender Adnexa is negative with normal sized ovaries by sonogram  Musculoskeletal: Normal range of motion. She exhibits no edema and no tenderness.  Neurological: She is alert and oriented to person, place, and time. She has ormal reflexes. She displays normal reflexes. No cranial nerve deficit. She exhibits normal muscle tone. Coordination normal.  Skin: Skin is warm and dry. No rash noted. No erythema. No pallor.  Psychiatric: She has a normal mood and affect. Her behavior is normal. Judgment and thought content normal.    Labs: No results found for this or any previous visit (from the past 336 hour(s)).  EKG: No orders found for this or any previous visit.  Imaging Studies: Imaging Results  No results found.      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

## 2018-10-25 ENCOUNTER — Ambulatory Visit (HOSPITAL_COMMUNITY): Payer: 59 | Admitting: Anesthesiology

## 2018-10-25 ENCOUNTER — Other Ambulatory Visit: Payer: Self-pay

## 2018-10-25 ENCOUNTER — Ambulatory Visit (HOSPITAL_COMMUNITY)
Admission: RE | Admit: 2018-10-25 | Discharge: 2018-10-25 | Disposition: A | Discharge status: Home / Self Care | Payer: 59 | Attending: Obstetrics & Gynecology | Admitting: Obstetrics & Gynecology

## 2018-10-25 ENCOUNTER — Encounter (HOSPITAL_COMMUNITY)
Admission: RE | Disposition: A | Discharge status: Home / Self Care | Payer: Self-pay | Source: Home / Self Care | Attending: Obstetrics & Gynecology

## 2018-10-25 DIAGNOSIS — Z8049 Family history of malignant neoplasm of other genital organs: Secondary | ICD-10-CM | POA: Diagnosis not present

## 2018-10-25 DIAGNOSIS — Z833 Family history of diabetes mellitus: Secondary | ICD-10-CM | POA: Diagnosis not present

## 2018-10-25 DIAGNOSIS — D069 Carcinoma in situ of cervix, unspecified: Secondary | ICD-10-CM | POA: Diagnosis not present

## 2018-10-25 DIAGNOSIS — Z8249 Family history of ischemic heart disease and other diseases of the circulatory system: Secondary | ICD-10-CM | POA: Insufficient documentation

## 2018-10-25 DIAGNOSIS — Z79899 Other long term (current) drug therapy: Secondary | ICD-10-CM | POA: Diagnosis not present

## 2018-10-25 DIAGNOSIS — R87613 High grade squamous intraepithelial lesion on cytologic smear of cervix (HGSIL): Secondary | ICD-10-CM | POA: Diagnosis not present

## 2018-10-25 DIAGNOSIS — Z820 Family history of epilepsy and other diseases of the nervous system: Secondary | ICD-10-CM | POA: Diagnosis not present

## 2018-10-25 DIAGNOSIS — N870 Mild cervical dysplasia: Secondary | ICD-10-CM | POA: Diagnosis not present

## 2018-10-25 HISTORY — PX: CERVICAL ABLATION: SHX5771

## 2018-10-25 LAB — GLUCOSE, CAPILLARY: Glucose-Capillary: 101 mg/dL — ABNORMAL HIGH (ref 70–99)

## 2018-10-25 SURGERY — ABLATION, CERVIX
Anesthesia: General

## 2018-10-25 MED ORDER — WATER FOR IRRIGATION, STERILE IR SOLN
Status: DC | PRN
  Administered 2018-10-25: 1000 mL

## 2018-10-25 MED ORDER — FERRIC SUBSULFATE 259 MG/GM EX SOLN
CUTANEOUS | Status: DC | PRN
  Administered 2018-10-25: 1 via TOPICAL

## 2018-10-25 MED ORDER — MIDAZOLAM HCL 2 MG/2ML IJ SOLN: 0.5000 mg | Freq: Once | INTRAMUSCULAR | Status: DC | PRN

## 2018-10-25 MED ORDER — KETOROLAC TROMETHAMINE 30 MG/ML IJ SOLN
INTRAMUSCULAR | Status: AC
  Filled 2018-10-25: qty 1

## 2018-10-25 MED ORDER — PROPOFOL 10 MG/ML IV BOLUS
INTRAVENOUS | Status: DC | PRN
  Administered 2018-10-25: 200 mg via INTRAVENOUS
  Administered 2018-10-25: 50 mg via INTRAVENOUS

## 2018-10-25 MED ORDER — KETOROLAC TROMETHAMINE 10 MG PO TABS: 10.0000 mg | ORAL_TABLET | Freq: Three times a day (TID) | ORAL | 0 refills | Status: DC | PRN

## 2018-10-25 MED ORDER — FERRIC SUBSULFATE 259 MG/GM EX SOLN
CUTANEOUS | Status: AC
  Filled 2018-10-25: qty 8

## 2018-10-25 MED ORDER — PROPOFOL 10 MG/ML IV BOLUS
INTRAVENOUS | Status: AC
  Filled 2018-10-25: qty 40

## 2018-10-25 MED ORDER — ONDANSETRON 8 MG PO TBDP: 8.0000 mg | ORAL_TABLET | Freq: Three times a day (TID) | ORAL | 0 refills | Status: DC | PRN

## 2018-10-25 MED ORDER — KETOROLAC TROMETHAMINE 30 MG/ML IJ SOLN
30.0000 mg | Freq: Once | INTRAMUSCULAR | Status: AC
  Administered 2018-10-25: 30 mg via INTRAVENOUS

## 2018-10-25 MED ORDER — HYDROCODONE-ACETAMINOPHEN 5-325 MG PO TABS: 1.0000 | ORAL_TABLET | Freq: Four times a day (QID) | ORAL | 0 refills | Status: DC | PRN

## 2018-10-25 MED ORDER — DEXAMETHASONE SODIUM PHOSPHATE 10 MG/ML IJ SOLN
INTRAMUSCULAR | Status: AC
  Filled 2018-10-25: qty 1

## 2018-10-25 MED ORDER — ONDANSETRON HCL 4 MG/2ML IJ SOLN
INTRAMUSCULAR | Status: AC
  Filled 2018-10-25: qty 2

## 2018-10-25 MED ORDER — PROMETHAZINE HCL 25 MG/ML IJ SOLN: 6.2500 mg | INTRAMUSCULAR | Status: DC | PRN

## 2018-10-25 MED ORDER — FENTANYL CITRATE (PF) 100 MCG/2ML IJ SOLN
INTRAMUSCULAR | Status: AC
  Filled 2018-10-25: qty 2

## 2018-10-25 MED ORDER — MIDAZOLAM HCL 2 MG/2ML IJ SOLN
INTRAMUSCULAR | Status: DC | PRN
  Administered 2018-10-25: 2 mg via INTRAVENOUS

## 2018-10-25 MED ORDER — FENTANYL CITRATE (PF) 100 MCG/2ML IJ SOLN
INTRAMUSCULAR | Status: DC | PRN
  Administered 2018-10-25: 100 ug via INTRAVENOUS

## 2018-10-25 MED ORDER — CEFAZOLIN SODIUM-DEXTROSE 2-4 GM/100ML-% IV SOLN
2.0000 g | INTRAVENOUS | Status: AC
  Administered 2018-10-25: 2 g via INTRAVENOUS

## 2018-10-25 MED ORDER — HYDROCODONE-ACETAMINOPHEN 7.5-325 MG PO TABS: 1.0000 | ORAL_TABLET | Freq: Once | ORAL | Status: DC | PRN

## 2018-10-25 MED ORDER — LACTATED RINGERS IV SOLN
INTRAVENOUS | Status: DC
  Administered 2018-10-25: 07:00:00 via INTRAVENOUS

## 2018-10-25 MED ORDER — MIDAZOLAM HCL 2 MG/2ML IJ SOLN
INTRAMUSCULAR | Status: AC
  Filled 2018-10-25: qty 2

## 2018-10-25 MED ORDER — HYDROMORPHONE HCL 1 MG/ML IJ SOLN
0.2500 mg | INTRAMUSCULAR | Status: DC | PRN
  Administered 2018-10-25: 0.5 mg via INTRAVENOUS
  Filled 2018-10-25: qty 0.5

## 2018-10-25 MED ORDER — DEXAMETHASONE SODIUM PHOSPHATE 10 MG/ML IJ SOLN
INTRAMUSCULAR | Status: DC | PRN
  Administered 2018-10-25: 4 mg via INTRAVENOUS

## 2018-10-25 MED ORDER — ONDANSETRON HCL 4 MG/2ML IJ SOLN
INTRAMUSCULAR | Status: DC | PRN
  Administered 2018-10-25: 4 mg via INTRAVENOUS

## 2018-10-25 MED ORDER — ACETIC ACID 5 % SOLN
Status: DC | PRN
  Administered 2018-10-25: 1 via TOPICAL

## 2018-10-25 MED ORDER — CEFAZOLIN SODIUM-DEXTROSE 2-4 GM/100ML-% IV SOLN
INTRAVENOUS | Status: AC
  Filled 2018-10-25: qty 100

## 2018-10-25 SURGICAL SUPPLY — 27 items
APPLICATOR COTTON TIP 6 STRL (MISCELLANEOUS) ×1 IMPLANT
APPLICATOR COTTON TIP 6IN STRL (MISCELLANEOUS) ×3
CLOTH BEACON ORANGE TIMEOUT ST (SAFETY) ×3 IMPLANT
COVER LIGHT HANDLE STERIS (MISCELLANEOUS) ×6 IMPLANT
COVER WAND RF STERILE (DRAPES) ×3 IMPLANT
GAUZE 4X4 16PLY RFD (DISPOSABLE) ×3 IMPLANT
GLOVE BIOGEL M 7.0 STRL (GLOVE) ×3 IMPLANT
GLOVE BIOGEL PI IND STRL 7.0 (GLOVE) ×1 IMPLANT
GLOVE BIOGEL PI IND STRL 8 (GLOVE) ×1 IMPLANT
GLOVE BIOGEL PI INDICATOR 7.0 (GLOVE) ×2
GLOVE BIOGEL PI INDICATOR 8 (GLOVE) ×2
GLOVE ECLIPSE 8.0 STRL XLNG CF (GLOVE) ×3 IMPLANT
GOWN STRL REUS W/TWL LRG LVL3 (GOWN DISPOSABLE) ×3 IMPLANT
GOWN STRL REUS W/TWL XL LVL3 (GOWN DISPOSABLE) ×3 IMPLANT
KIT TURNOVER KIT A (KITS) ×3 IMPLANT
LASER FIBER DISP 1000U (UROLOGICAL SUPPLIES) ×3 IMPLANT
MANIFOLD NEPTUNE II (INSTRUMENTS) ×3 IMPLANT
MARKER SKIN DUAL TIP RULER LAB (MISCELLANEOUS) ×3 IMPLANT
PACK BASIC III (CUSTOM PROCEDURE TRAY) ×2
PACK SRG BSC III STRL LF ECLPS (CUSTOM PROCEDURE TRAY) ×1 IMPLANT
PAD ARMBOARD 7.5X6 YLW CONV (MISCELLANEOUS) ×3 IMPLANT
PREFILTER SMOKE EVAC (FILTER) ×3 IMPLANT
SET BASIN LINEN APH (SET/KITS/TRAYS/PACK) ×3 IMPLANT
SHEET LAVH (DRAPES) ×3 IMPLANT
SWAB PROCTOSCOPIC (MISCELLANEOUS) ×6 IMPLANT
TUBING SMOKE EVAC CO2 (TUBING) ×3 IMPLANT
WATER STERILE IRR 1000ML POUR (IV SOLUTION) ×3 IMPLANT

## 2018-10-25 NOTE — Op Note (Signed)
Preoperative Diagnosis:  High Grade Squamous Intraepithelial lesion, adequate colposcopy                                          Very large wide extension of columner epithelium with large lesion                                          Extending almost to the vagina posteriorly  Postoperative Diagnosis:  Same as above  Procedure:  Laser ablation of the cervix  Surgeon:  Florian Buff MD  Anaesthesia:  Laryngeal Mask Airway  Findings:  Patient had an abnormal pap smear which was evaluated in the office with colposcopy and directed biopsies.  Pathology report returned as high Grade SIL. Additionally the lesions is very very large.  The colposcopy was adequate.  As a result, the patient is admitted for laser ablation of the cervix.  Description of Note:  Patient was taken to the OR and placed in the supine position where she underwent laryngeal mask airway anaesthesia.  She was placed in the dorsal lithotomy position.  She was draped for laser.  Graves speculum was placed and 3% acetic acid used and the laser microscope employed to perform colposcopy which confirmed the office findings.  Laser was used on typical cervical settings and used to vaporized the squamocolumnar junction to  depth of  5-7 mm peripherally and 7-9 mm centrally.  Surgical margin of several mm was employed beyond the acetowhite epithelium.  Hemostasis was achieved with the laser and Monsel's solution.  Patient was awakened from anaesthesia in good stable condition and all counts were correct.  She received Ancef 2 gram and Toradol 30 mg IV preoperatively prophylactically.  Florian Buff, MD 10/25/2018 8:00 AM

## 2018-10-25 NOTE — Interval H&P Note (Signed)
History and Physical Interval Note:  10/25/2018 7:13 AM  Lynn Hale  has presented today for surgery, with the diagnosis of High Grade Squamous Cervical Dysplasia.  The various methods of treatment have been discussed with the patient and family. After consideration of risks, benefits and other options for treatment, the patient has consented to  Procedure(s): CERVICAL ABLATION(LASER ABLATION OF CERVIX) (N/A) as a surgical intervention.  The patient's history has been reviewed, patient examined, no change in status, stable for surgery.  I have reviewed the patient's chart and labs.  Questions were answered to the patient's satisfaction.     Florian Buff

## 2018-10-25 NOTE — Anesthesia Postprocedure Evaluation (Signed)
Anesthesia Post Note  Patient: Lynn Hale  Procedure(s) Performed: CERVICAL ABLATION(LASER ABLATION OF CERVIX) (N/A )  Patient location during evaluation: PACU Anesthesia Type: General Level of consciousness: awake and alert and patient cooperative Pain management: satisfactory to patient Vital Signs Assessment: post-procedure vital signs reviewed and stable Respiratory status: spontaneous breathing Cardiovascular status: stable Postop Assessment: no apparent nausea or vomiting Anesthetic complications: no     Last Vitals:  Vitals:   10/25/18 0845 10/25/18 0852  BP: 121/71 122/80  Pulse: 79 80  Resp: 13 16  Temp:    SpO2: 100% 99%    Last Pain:  Vitals:   10/25/18 0852  TempSrc:   PainSc: 5                  [name]

## 2018-10-25 NOTE — Transfer of Care (Signed)
Immediate Anesthesia Transfer of Care Note  Patient: Lynn Hale  Procedure(s) Performed: CERVICAL ABLATION(LASER ABLATION OF CERVIX) (N/A )  Patient Location: PACU  Anesthesia Type:General  Level of Consciousness: awake, alert  and patient cooperative  Airway & Oxygen Therapy: Patient Spontanous Breathing and non-rebreather face mask  Post-op Assessment: Report given to RN and Post -op Vital signs reviewed and stable  Post vital signs: Reviewed and stable  Last Vitals:  Vitals Value Taken Time  BP    Temp    Pulse 90 10/25/2018  8:12 AM  Resp 18 10/25/2018  8:12 AM  SpO2 94 % 10/25/2018  8:12 AM  Vitals shown include unvalidated device data.  Last Pain:  Vitals:   10/25/18 0643  TempSrc: Oral  PainSc: 0-No pain         Complications: No apparent anesthesia complications

## 2018-10-25 NOTE — Discharge Instructions (Signed)
Cervical Laser Surgery, Care After    This sheet gives you information about how to care for yourself after your procedure. Your health care provider may also give you more specific instructions. If you have problems or questions, contact your health care provider.  What can I expect after the procedure?  After the procedure, it is common to have:   Pain or discomfort.   Mild cramping.   Bleeding, spotting, or brownish discharge from your vagina.  Follow these instructions at home:  Activity   Return to your normal activities as told by your health care provider. Ask your health care provider what activities are safe for you.   Do not lift anything that is heavier than 10 lb (4.5 kg), or the limit that your health care provider tells you, until he or she says that it is safe.   Do not have sex or put anything in your vagina until your health care provider says it is okay.  General instructions   Take over-the-counter and prescription medicines only as told by your health care provider.   Do not drive or use heavy machinery while taking prescription pain medicine.   Wear sanitary pads to protect from bleeding, spotting, and discharge.   Do not use tampons or douche until your health care provider says it is okay.   It is up to you to get the results of your procedure. Ask your health care provider, or the department that is doing the procedure, when your results will be ready.   Keep all follow-up visits as told by your health care provider. This is important.  Contact a health care provider if:   Your pain or cramping does not improve.   Your periods are more painful than usual.   You do not get your period as expected.  Get help right away if:   You have any symptoms of infection, such as:  ? A fever.  ? Chills.  ? Discharge that smells bad.   You have severe pain in your abdomen.   You have heavy bleeding from your vagina (more than a normal period).   You have vaginal bleeding with clumps of  blood (blood clots).  Summary   After this procedure, it is common to have pain or discomfort and mild cramping. It is also common to have bleeding, spotting, or brownish discharge from your vagina.   You may need to wear sanitary pads to protect from bleeding, spotting, and discharge.   Do not have sex, use tampons, or douche until your health care provider says it is okay.   Return to your normal activities as told by your health care provider. Ask your health care provider what activities are safe for you.   Take over-the-counter and prescription medicines only as told by your health care provider. These include medicines for pain.  This information is not intended to replace advice given to you by your health care provider. Make sure you discuss any questions you have with your health care provider.  Document Released: 06/19/2016 Document Revised: 06/19/2016 Document Reviewed: 06/19/2016  Elsevier Interactive Patient Education  2019 [employer]

## 2018-10-25 NOTE — Anesthesia Procedure Notes (Signed)
Procedure Name: LMA Insertion Date/Time: 10/25/2018 7:31 AM Performed by: Vista Deck, CRNA Pre-anesthesia Checklist: Patient identified, Patient being monitored, Emergency Drugs available, Timeout performed and Suction available Patient Re-evaluated:Patient Re-evaluated prior to induction Oxygen Delivery Method: Circle System Utilized Preoxygenation: Pre-oxygenation with 100% oxygen Induction Type: IV induction Ventilation: Mask ventilation without difficulty LMA: LMA inserted LMA Size: 3.0 Number of attempts: 1 Placement Confirmation: positive ETCO2 and breath sounds checked- equal and bilateral Tube secured with: Tape Dental Injury: Teeth and Oropharynx as per pre-operative assessment

## 2018-10-25 NOTE — Anesthesia Preprocedure Evaluation (Signed)
Anesthesia Evaluation  Patient identified by MRN, date of birth, ID band Patient awake    Reviewed: Allergy & Precautions, NPO status , Patient's Chart, lab work & pertinent test results  Airway Mallampati: II  TM Distance: >3 FB Neck ROM: Full    Dental no notable dental hx.    Pulmonary neg pulmonary ROS,    Pulmonary exam normal breath sounds clear to auscultation       Cardiovascular Exercise Tolerance: Good negative cardio ROS Normal cardiovascular examI Rhythm:Regular Rate:Normal     Neuro/Psych negative neurological ROS  negative psych ROS   GI/Hepatic negative GI ROS, Neg liver ROS,   Endo/Other  negative endocrine ROS  Renal/GU negative Renal ROS  negative genitourinary   Musculoskeletal negative musculoskeletal ROS (+)   Abdominal   Peds negative pediatric ROS (+)  Hematology negative hematology ROS (+)   Anesthesia Other Findings Very Large Tonsils Bilat  Reproductive/Obstetrics negative OB ROS                             Anesthesia Physical Anesthesia Plan  ASA: I  Anesthesia Plan: General   Post-op Pain Management:    Induction: Intravenous  PONV Risk Score and Plan:   Airway Management Planned: Oral ETT and LMA  Additional Equipment:   Intra-op Plan:   Post-operative Plan: Extubation in OR  Informed Consent: I have reviewed the patients History and Physical, chart, labs and discussed the procedure including the risks, benefits and alternatives for the proposed anesthesia with the patient or authorized representative who has indicated his/her understanding and acceptance.     Dental advisory given  Plan Discussed with: CRNA  Anesthesia Plan Comments: (LMA vs ETT as needed )        Anesthesia Quick Evaluation

## 2018-10-28 ENCOUNTER — Encounter (HOSPITAL_COMMUNITY): Payer: Self-pay | Admitting: Obstetrics & Gynecology

## 2018-10-31 ENCOUNTER — Encounter: Payer: 59 | Admitting: Obstetrics & Gynecology

## 2018-11-01 ENCOUNTER — Ambulatory Visit (INDEPENDENT_AMBULATORY_CARE_PROVIDER_SITE_OTHER): Payer: 59 | Admitting: Obstetrics & Gynecology

## 2018-11-01 ENCOUNTER — Encounter: Payer: Self-pay | Admitting: Obstetrics & Gynecology

## 2018-11-01 ENCOUNTER — Other Ambulatory Visit: Payer: Self-pay

## 2018-11-01 VITALS — BP 120/74 | HR 89 | Ht 64.0 in | Wt 182.0 lb

## 2018-11-01 DIAGNOSIS — Z9889 Other specified postprocedural states: Secondary | ICD-10-CM

## 2018-11-01 NOTE — Progress Notes (Signed)
  HPI: Patient returns for routine postoperative follow-up having undergone laser ablation of the cervix 10/23/2018 on .  The patient's immediate postoperative recovery has been unremarkable. Since hospital discharge the patient reports no problems.   Current Outpatient Medications: acetaminophen (TYLENOL) 500 MG tablet, Take 1,000 mg by mouth every 6 (six) hours as needed for moderate pain or headache., Disp: , Rfl:  calcium carbonate (TUMS - DOSED IN MG ELEMENTAL CALCIUM) 500 MG chewable tablet, Chew 1 tablet by mouth daily as needed for indigestion or heartburn., Disp: , Rfl:  ibuprofen (ADVIL,MOTRIN) 200 MG tablet, Take 600 mg by mouth every 6 (six) hours as needed for headache or moderate pain., Disp: , Rfl:  Melatonin 5 MG CAPS, Take 5 mg by mouth at bedtime as needed (sleep)., Disp: , Rfl:  HYDROcodone-acetaminophen (NORCO/VICODIN) 5-325 MG tablet, Take 1 tablet by mouth every 6 (six) hours as needed. (Patient not taking: Reported on 11/01/2018), Disp: 6 tablet, Rfl: 0 ketorolac (TORADOL) 10 MG tablet, Take 1 tablet (10 mg total) by mouth every 8 (eight) hours as needed. (Patient not taking: Reported on 11/01/2018), Disp: 15 tablet, Rfl: 0 norgestimate-ethinyl estradiol (ORTHO-CYCLEN,SPRINTEC,PREVIFEM) 0.25-35 MG-MCG tablet, Take 1 tablet by mouth daily. (Patient not taking: Reported on 11/01/2018), Disp: 1 Package, Rfl: 11 ondansetron (ZOFRAN ODT) 8 MG disintegrating tablet, Take 1 tablet (8 mg total) by mouth every 8 (eight) hours as needed for nausea or vomiting. (Patient not taking: Reported on 11/01/2018), Disp: 20 tablet, Rfl: 0 sodium chloride (OCEAN) 0.65 % SOLN nasal spray, Place 1 spray into both nostrils as needed for congestion., Disp: , Rfl:   No current facility-administered medications for this visit.     Blood pressure 120/74, pulse 89, height 5\' 4"  (1.626 m), weight 182 lb (82.6 kg), last menstrual period 10/14/2018.  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

## 2018-11-25 ENCOUNTER — Telehealth: Payer: Self-pay | Admitting: *Deleted

## 2018-11-25 NOTE — Telephone Encounter (Signed)
Patient informed that we are not allowing visitors or children to come to appointments at this time. Patient denies any contact with anyone suspected or confirmed of having COVID-19. Pt denies fever, cough, sob, muscle pain, diarrhea, rash, vomiting, abdominal pain, red eye, weakness, bruising or bleeding, joint pain or severe headache.  

## 2018-11-26 ENCOUNTER — Ambulatory Visit (INDEPENDENT_AMBULATORY_CARE_PROVIDER_SITE_OTHER): Payer: 59 | Admitting: Obstetrics & Gynecology

## 2018-11-26 ENCOUNTER — Other Ambulatory Visit: Payer: Self-pay

## 2018-11-26 ENCOUNTER — Encounter: Payer: Self-pay | Admitting: Obstetrics & Gynecology

## 2018-11-26 VITALS — BP 122/80 | HR 92 | Ht 64.0 in | Wt 184.0 lb

## 2018-11-26 DIAGNOSIS — Z9889 Other specified postprocedural states: Secondary | ICD-10-CM

## 2018-11-26 DIAGNOSIS — R87613 High grade squamous intraepithelial lesion on cytologic smear of cervix (HGSIL): Secondary | ICD-10-CM

## 2018-11-26 NOTE — Progress Notes (Signed)
  HPI: Patient returns for routine postoperative follow-up having undergone laser ablation of the cervix on 10/25/2018.  The patient's immediate postoperative recovery has been unremarkable. Since hospital discharge the patient reports no problems.   Current Outpatient Medications: norgestimate-ethinyl estradiol (ORTHO-CYCLEN,SPRINTEC,PREVIFEM) 0.25-35 MG-MCG tablet, Take 1 tablet by mouth daily., Disp: 1 Package, Rfl: 11  No current facility-administered medications for this visit.     Blood pressure 122/80, pulse 92, height 5\' 4"  (1.626 m), weight 184 lb (83.5 kg).  Physical Exam: Normal exam  Diagnostic Tests:   Pathology: HSIL on cx bx  Impression: S/p laser ablation of the cervix  Plan: Return in about 6 months (around 05/28/2019) for Pap surveillance.   Follow up: 6  months  Florian Buff, MD

## 2019-05-01 ENCOUNTER — Other Ambulatory Visit: Payer: Self-pay | Admitting: Advanced Practice Midwife

## 2019-05-08 ENCOUNTER — Other Ambulatory Visit (HOSPITAL_COMMUNITY): Payer: Self-pay | Admitting: Pulmonary Disease

## 2019-05-08 ENCOUNTER — Other Ambulatory Visit: Payer: Self-pay | Admitting: Pulmonary Disease

## 2019-05-08 DIAGNOSIS — R109 Unspecified abdominal pain: Secondary | ICD-10-CM

## 2019-05-19 ENCOUNTER — Ambulatory Visit (HOSPITAL_COMMUNITY)
Admission: RE | Admit: 2019-05-19 | Discharge: 2019-05-19 | Disposition: A | Discharge status: Home / Self Care | Payer: 59 | Source: Ambulatory Visit | Attending: Pulmonary Disease | Admitting: Pulmonary Disease

## 2019-05-19 ENCOUNTER — Other Ambulatory Visit: Payer: Self-pay

## 2019-05-19 DIAGNOSIS — R109 Unspecified abdominal pain: Secondary | ICD-10-CM | POA: Diagnosis not present

## 2019-05-22 LAB — BASIC METABOLIC PANEL
BUN: 14 (ref 4–21)
CO2: 26 — AB (ref 13–22)
Chloride: 105 (ref 99–108)
Creatinine: 0.7 (ref <=1.1)
Glucose: 97
Potassium: 4.5 (ref 3.4–5.3)
Sodium: 140 (ref 137–147)

## 2019-05-22 LAB — HEPATIC FUNCTION PANEL
ALT: 11 (ref 7–35)
AST: 12 — AB (ref 13–35)
Alkaline Phosphatase: 49 (ref 25–125)
Bilirubin, Total: 0.3

## 2019-05-22 LAB — LIPID PANEL
Cholesterol: 166 (ref 0–200)
HDL: 53 (ref 35–70)
LDL Cholesterol: 91
Triglycerides: 128 (ref 40–160)

## 2019-05-22 LAB — COMPREHENSIVE METABOLIC PANEL
Albumin: 4.3 (ref 3.5–5.0)
Calcium: 9.5 (ref 8.7–10.7)
GFR calc Af Amer: 135
GFR calc non Af Amer: 116
Globulin: 29

## 2019-05-22 LAB — CBC AND DIFFERENTIAL
HCT: 40 (ref 36–46)
Hemoglobin: 13.5 (ref 12.0–16.0)
Neutrophils Absolute: 3260
Platelets: 344 (ref 150–399)
WBC: 6.6

## 2019-05-22 LAB — TSH: TSH: 2.13 (ref <=5.90)

## 2019-05-22 LAB — CBC: RBC: 4.34 (ref 3.87–5.11)

## 2019-06-23 ENCOUNTER — Other Ambulatory Visit: Payer: Self-pay | Admitting: Advanced Practice Midwife

## 2019-10-02 ENCOUNTER — Other Ambulatory Visit: Payer: 59 | Admitting: Obstetrics & Gynecology

## 2019-10-20 IMAGING — US US ABDOMEN COMPLETE
1 series · 14 of 25 positions shown · non-contrast
Comparison: None.

CLINICAL DATA: Abdominal pain.

EXAM:
ABDOMEN ULTRASOUND COMPLETE

[Series 1: us abdomen complete · 14 of 99 slices shown]
[im 1/99]
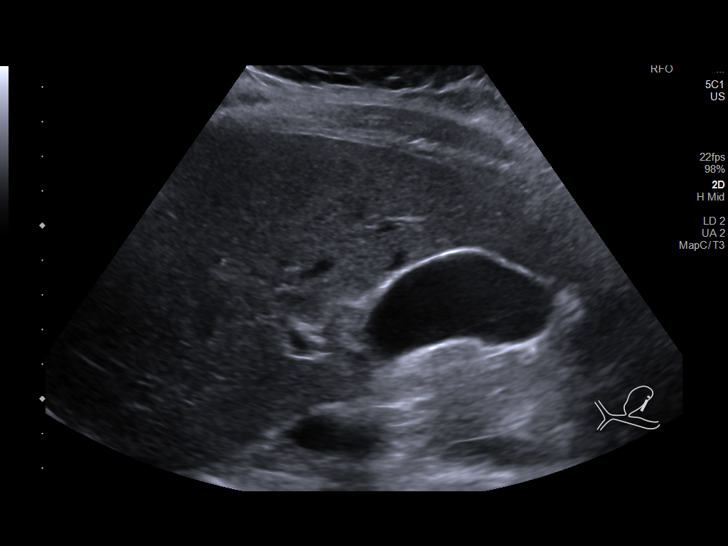
[im 9/99]
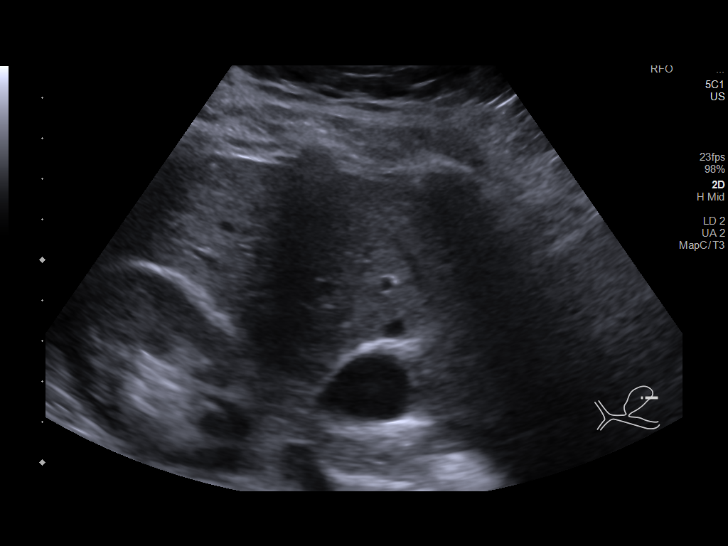
[im 17/99]
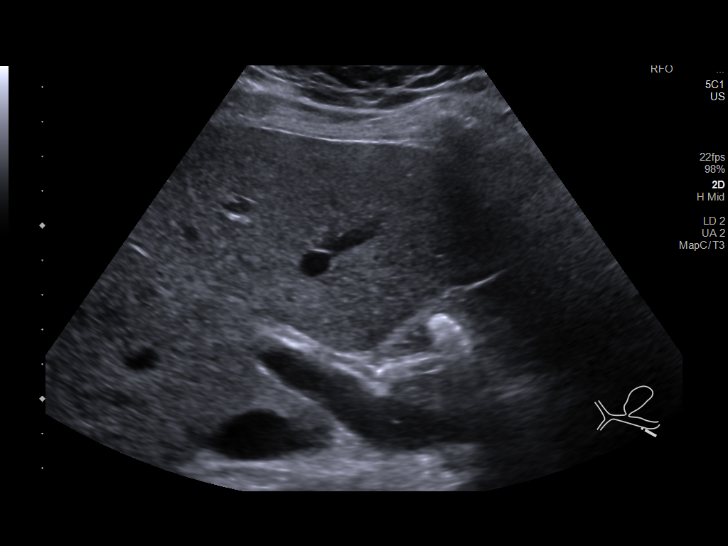
[im 25/99]
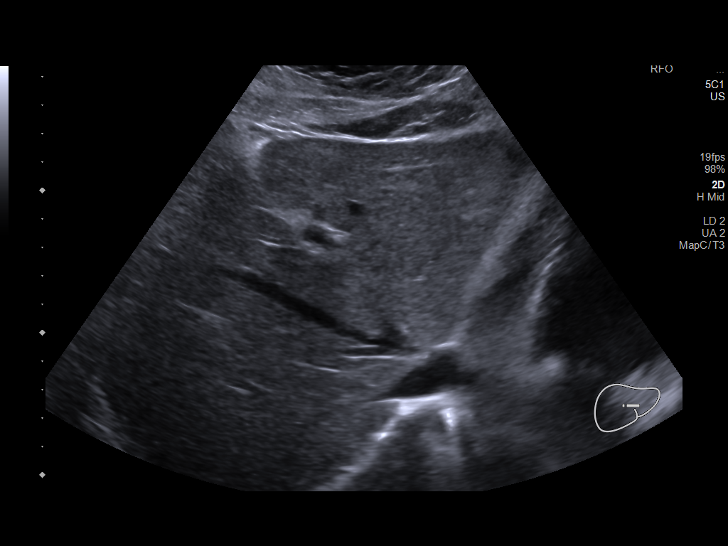
[im 33/99]
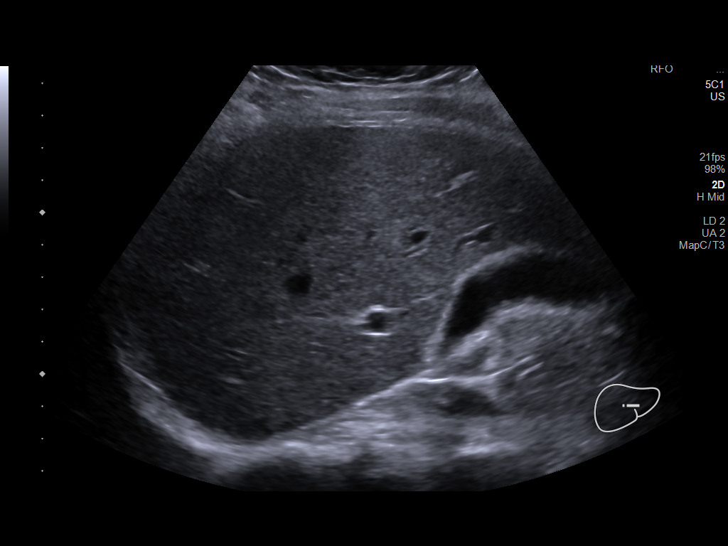
[im 37/99]
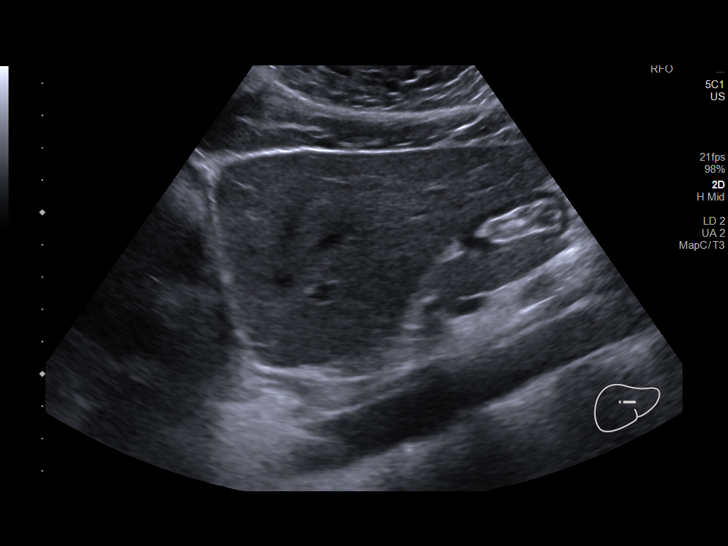
[im 45/99]
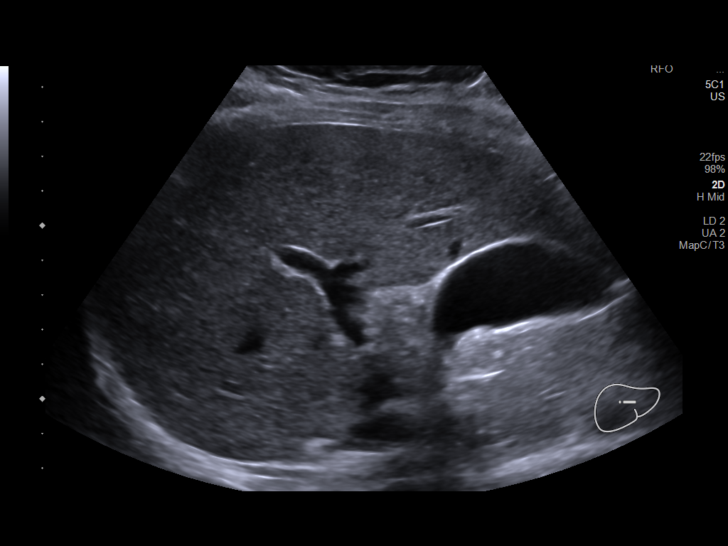
[im 54/99]
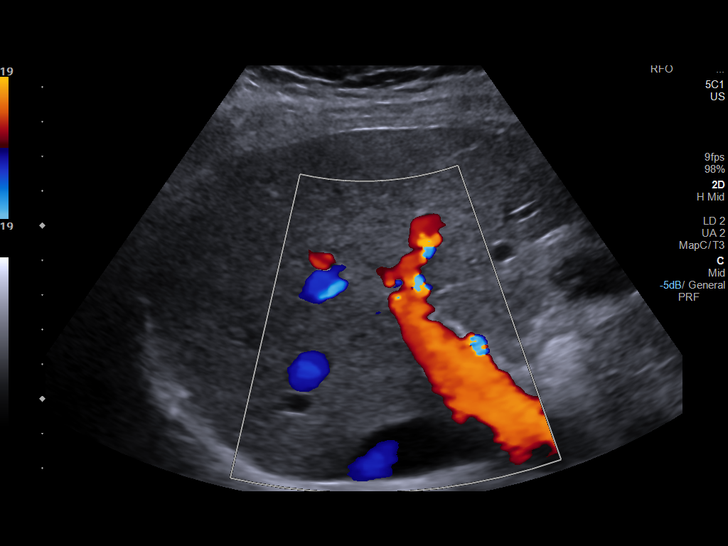
[im 62/99]
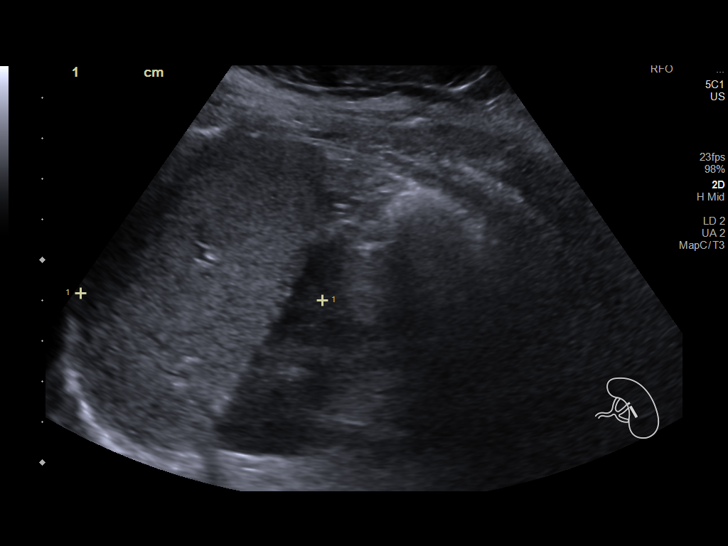
[im 66/99]
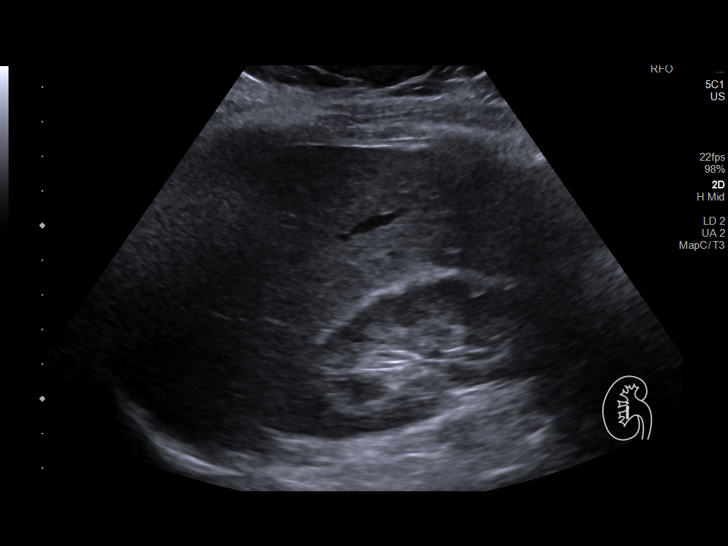
[im 74/99]
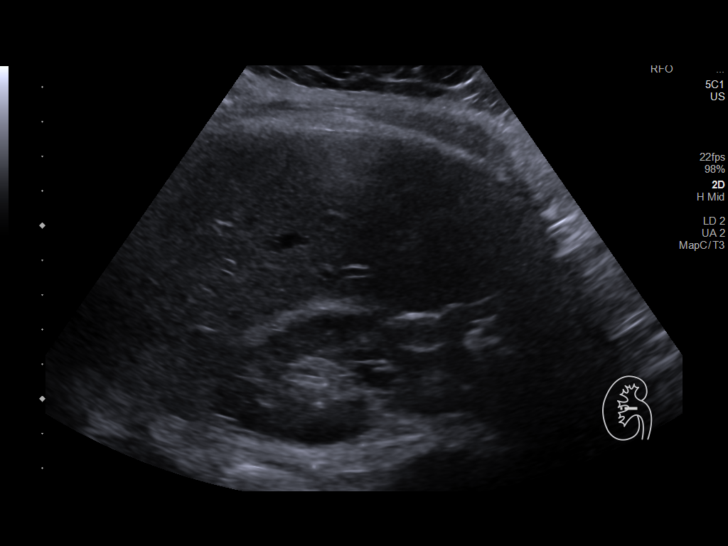
[im 82/99]
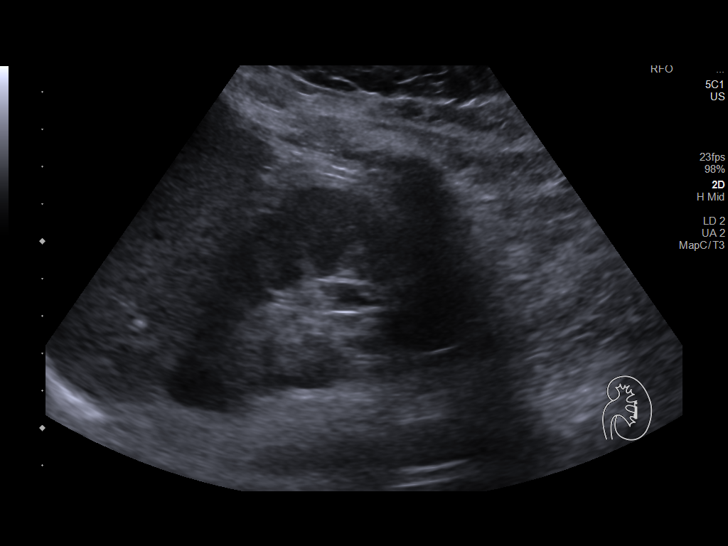
[im 90/99]
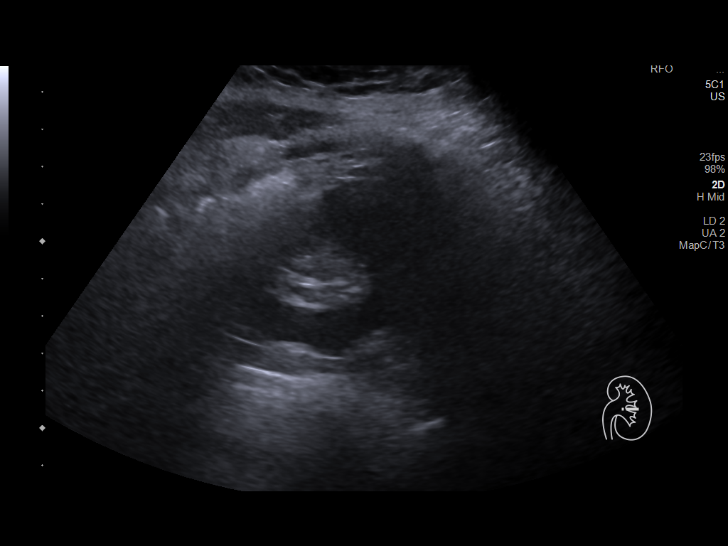
[im 99/99]
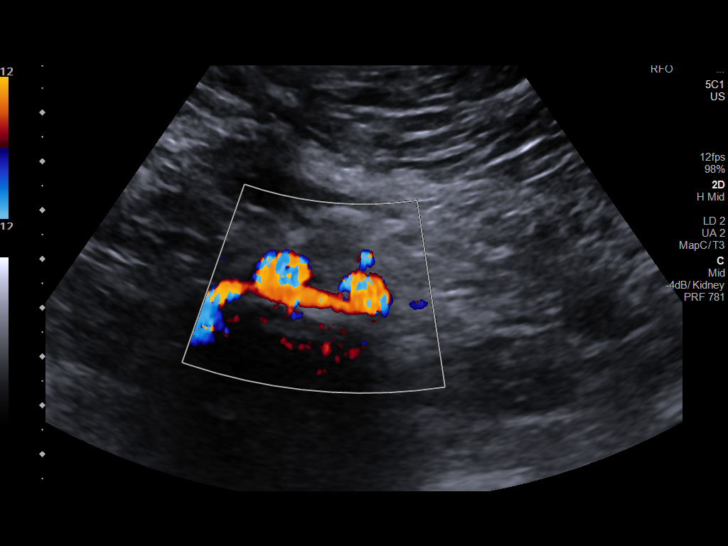

[14 of 25 positions shown; findings below may reference images not displayed]

FINDINGS: Gallbladder: No gallstones or wall thickening visualized. No
sonographic Murphy sign noted by sonographer.

Common bile duct: Diameter: 3 mm

Liver: No focal lesion identified. Within normal limits in
parenchymal echogenicity. Portal vein is patent on color Doppler
imaging with normal direction of blood flow towards the liver.

IVC: No abnormality visualized.

Pancreas: Visualized portion unremarkable.

Spleen: Size and appearance within normal limits.

Right Kidney: Length: 9.5 cm. Echogenicity within normal limits. No
mass or hydronephrosis visualized.

Left Kidney: Length: 9.5 cm. Echogenicity within normal limits. No
mass or hydronephrosis visualized.

Abdominal aorta: No aneurysm visualized.

Other findings: None.
IMPRESSION: Normal study.

## 2019-11-06 ENCOUNTER — Other Ambulatory Visit: Payer: 59 | Admitting: Obstetrics & Gynecology

## 2019-11-27 ENCOUNTER — Other Ambulatory Visit: Payer: Self-pay

## 2019-11-27 ENCOUNTER — Other Ambulatory Visit (HOSPITAL_COMMUNITY)
Admission: RE | Admit: 2019-11-27 | Discharge: 2019-11-27 | Disposition: A | Discharge status: Home / Self Care | Payer: 59 | Source: Ambulatory Visit | Attending: Obstetrics & Gynecology | Admitting: Obstetrics & Gynecology

## 2019-11-27 ENCOUNTER — Encounter: Payer: Self-pay | Admitting: Obstetrics & Gynecology

## 2019-11-27 ENCOUNTER — Ambulatory Visit (INDEPENDENT_AMBULATORY_CARE_PROVIDER_SITE_OTHER): Payer: 59 | Admitting: Obstetrics & Gynecology

## 2019-11-27 VITALS — Ht 64.0 in | Wt 178.0 lb

## 2019-11-27 DIAGNOSIS — Z01419 Encounter for gynecological examination (general) (routine) without abnormal findings: Secondary | ICD-10-CM

## 2019-11-27 MED ORDER — NORGESTIMATE-ETH ESTRADIOL 0.25-35 MG-MCG PO TABS: 1.0000 | ORAL_TABLET | Freq: Every day | ORAL | 12 refills | Status: DC

## 2019-11-27 NOTE — Progress Notes (Signed)
Subjective:     Lynn Hale is a 29 y.o. female here for a routine exam.  No LMP recorded. (Menstrual status: Oral contraceptives). G1P1001 Birth Control Method:  OCP Menstrual Calendar(currently): regualr  Current complaints: none.   Current acute medical issues:  none   Recent Gynecologic History No LMP recorded. (Menstrual status: Oral contraceptives). Last Pap: had laser of cervix 3/20,  For HSIL Last mammogram: ,  n/a  Past Medical History:  Diagnosis Date  . Medical history non-contributory   . Pregnant 02/10/2015  . Vaginal bleeding in pregnancy 02/17/2015  . Vaginal Pap smear, abnormal     Past Surgical History:  Procedure Laterality Date  . CERVICAL ABLATION N/A 10/25/2018   Procedure: CERVICAL ABLATION(LASER ABLATION OF CERVIX);  Surgeon: Florian Buff, MD;  Location: [facility];  Service: Gynecology;  Laterality: N/A;  . WISDOM TOOTH EXTRACTION      OB History    Gravida  1   Para  1   Term  1   Preterm      AB      Living  1     SAB      TAB      Ectopic      Multiple  0   Live Births  1           Social History   Socioeconomic History  . Marital status: Married    Spouse name: Not on file  . Number of children: 1  . Years of education: Not on file  . Highest education level: Not on file  Occupational History  . Not on file  Tobacco Use  . Smoking status: Never Smoker  . Smokeless tobacco: Never Used  Substance and Sexual Activity  . Alcohol use: Yes    Comment: occ  . Drug use: No  . Sexual activity: Yes    Birth control/protection: Condom, Pill  Other Topics Concern  . Not on file  Social History Narrative  . Not on file   Social Determinants of Health   Financial Resource Strain: Low Risk   . Difficulty of Paying Living Expenses: Not hard at all  Food Insecurity: No Food Insecurity  . Worried About Charity fundraiser in the Last Year: Never true  . Ran Out of Food in the Last Year: Never true  Transportation  Needs: No Transportation Needs  . Lack of Transportation (Medical): No  . Lack of Transportation (Non-Medical): No  Physical Activity: Insufficiently Active  . Days of Exercise per Week: 2 days  . Minutes of Exercise per Session: 20 min  Stress: No Stress Concern Present  . Feeling of Stress : Not at all  Social Connections: Not Isolated  . Frequency of Communication with Friends and Family: Three times a week  . Frequency of Social Gatherings with Friends and Family: Once a week  . Attends Religious Services: More than 4 times per year  . Active Member of Clubs or Organizations: Yes  . Attends Archivist Meetings: More than 4 times per year  . Marital Status: Married    Family History  Problem Relation Age of Onset  . Cancer Mother        cervical  . Diabetes Maternal Grandmother   . Heart disease Maternal Grandmother   . Glaucoma Paternal Grandmother   . Dementia Paternal Grandfather      Current Outpatient Medications:  Marland Kitchen  Melatonin 3 MG TABS, Take 3 mg by mouth at bedtime., Disp: ,  Rfl:  .  SPRINTEC 28 0.25-35 MG-MCG tablet, TAKE ONE TABLET BY MOUTH ONCE DAILY., Disp: 28 tablet, Rfl: 11  Review of Systems  Review of Systems  Constitutional: Negative for fever, chills, weight loss, malaise/fatigue and diaphoresis.  HENT: Negative for hearing loss, ear pain, nosebleeds, congestion, sore throat, neck pain, tinnitus and ear discharge.   Eyes: Negative for blurred vision, double vision, photophobia, pain, discharge and redness.  Respiratory: Negative for cough, hemoptysis, sputum production, shortness of breath, wheezing and stridor.   Cardiovascular: Negative for chest pain, palpitations, orthopnea, claudication, leg swelling and PND.  Gastrointestinal: negative for abdominal pain. Negative for heartburn, nausea, vomiting, diarrhea, constipation, blood in stool and melena.  Genitourinary: Negative for dysuria, urgency, frequency, hematuria and flank pain.   Musculoskeletal: Negative for myalgias, back pain, joint pain and falls.  Skin: Negative for itching and rash.  Neurological: Negative for dizziness, tingling, tremors, sensory change, speech change, focal weakness, seizures, loss of consciousness, weakness and headaches.  Endo/Heme/Allergies: Negative for environmental allergies and polydipsia. Does not bruise/bleed easily.  Psychiatric/Behavioral: Negative for depression, suicidal ideas, hallucinations, memory loss and substance abuse. The patient is not nervous/anxious and does not have insomnia.        Objective:  Height 5\' 4"  (1.626 m), weight 178 lb (80.7 kg).   Physical Exam  Vitals reviewed. Constitutional: She is oriented to person, place, and time. She appears well-developed and well-nourished.  HENT:  Head: Normocephalic and atraumatic.        Right Ear: External ear normal.  Left Ear: External ear normal.  Nose: Nose normal.  Mouth/Throat: Oropharynx is clear and moist.  Eyes: Conjunctivae and EOM are normal. Pupils are equal, round, and reactive to light. Right eye exhibits no discharge. Left eye exhibits no discharge. No scleral icterus.  Neck: Normal range of motion. Neck supple. No tracheal deviation present. No thyromegaly present.  Cardiovascular: Normal rate, regular rhythm, normal heart sounds and intact distal pulses.  Exam reveals no gallop and no friction rub.   No murmur heard. Respiratory: Effort normal and breath sounds normal. No respiratory distress. She has no wheezes. She has no rales. She exhibits no tenderness.  GI: Soft. Bowel sounds are normal. She exhibits no distension and no mass. There is no tenderness. There is no rebound and no guarding.  Genitourinary:  Breasts no masses skin changes or nipple changes bilaterally      Vulva is normal without lesions Vagina is pink moist without discharge Cervix normal in appearance and pap is done Uterus is normal size shape and contour Adnexa is negative with  normal sized ovaries   Musculoskeletal: Normal range of motion. She exhibits no edema and no tenderness.  Neurological: She is alert and oriented to person, place, and time. She has normal reflexes. She displays normal reflexes. No cranial nerve deficit. She exhibits normal muscle tone. Coordination normal.  Skin: Skin is warm and dry. No rash noted. No erythema. No pallor.  Psychiatric: She has a normal mood and affect. Her behavior is normal. Judgment and thought content normal.       Medications Ordered at today's visit: No orders of the defined types were placed in this encounter.   Other orders placed at today's visit: No orders of the defined types were placed in this encounter.     Assessment:    Normal Gyn exam.    Plan:    Contraception: OCP (estrogen/progesterone). Follow up in: 1 year.     No follow-ups on file.

## 2019-12-01 LAB — CYTOLOGY - PAP
Comment: NEGATIVE
Diagnosis: NEGATIVE
High risk HPV: NEGATIVE

## 2020-01-15 ENCOUNTER — Ambulatory Visit: Payer: 59 | Admitting: Family Medicine

## 2020-07-23 ENCOUNTER — Ambulatory Visit: Payer: 59 | Admitting: Allergy & Immunology

## 2020-08-14 HISTORY — PX: TONSILLECTOMY: SHX5217

## 2020-08-20 ENCOUNTER — Other Ambulatory Visit: Payer: Self-pay

## 2020-08-20 ENCOUNTER — Encounter: Payer: Self-pay | Admitting: Allergy & Immunology

## 2020-08-20 ENCOUNTER — Ambulatory Visit (INDEPENDENT_AMBULATORY_CARE_PROVIDER_SITE_OTHER): Payer: 59 | Admitting: Allergy & Immunology

## 2020-08-20 VITALS — BP 102/74 | HR 82 | Temp 97.7°F | Resp 18 | Ht 64.57 in | Wt 181.8 lb

## 2020-08-20 DIAGNOSIS — J3089 Other allergic rhinitis: Secondary | ICD-10-CM | POA: Diagnosis not present

## 2020-08-20 DIAGNOSIS — L249 Irritant contact dermatitis, unspecified cause: Secondary | ICD-10-CM

## 2020-08-20 NOTE — Patient Instructions (Addendum)
1. Perennial allergic rhinitis - Testing today showed: indoor molds, dust mites, cat and dog - Copy of test results provided.  - Avoidance measures provided. - Start taking: Zyrtec (cetirizine) 10mg  tablet TWICE DAILY DURING FLARES - Use consistently for a few days of flares to control symptoms. - These  Medications are certainly SAFE to use on a daily basis, but obviously we want to only give you the least amount of medications to keep you comfortable. - You can use an extra dose of the antihistamine, if needed, for breakthrough symptoms.  - Consider nasal saline rinses 1-2 times daily to remove allergens from the nasal cavities. - I do not think we need allergy shots or anything more extreme at this point, but we can revisit that in the future if needed. - Tobacco smoke causes issues even in non-allergic people, since the smoke makes the cilia in your airways dysfunctional (cilia are microscopic finger-like projections that keep airways clear of dust etc. - Avoid cigarette exposure as much as you can.  2. Return in about 3 months (around 11/18/2020).    Please inform us of any Emergency Department visits, hospitalizations, or changes in symptoms. Call us before going to the ED for breathing or allergy symptoms since we might be able to fit you in for a sick visit. Feel free to contact us anytime with any questions, problems, or concerns.  It was a pleasure to meet you today!  Websites that have reliable patient information: 1. American Academy of Asthma, Allergy, and Immunology: www.aaaai.org 2. Food Allergy Research and Education (FARE): foodallergy.org 3. Mothers of Asthmatics: http://www.asthmacommunitynetwork.org 4. American College of Allergy, Asthma, and Immunology: www.acaai.org   COVID-19 Vaccine Information can be found at: [email or web address].[email or web address] For questions related to vaccine distribution or appointments, please email  [email or web address] or call [phone].     "Like" [place] on Facebook and Instagram for our latest updates!       Make sure you are registered to vote! If you have moved or changed any of your contact information, you will need to get this updated before voting!  In some cases, you MAY be able to register to vote online: [email or web address]     Control of Dust Mite Allergen    Dust mites play a major role in allergic asthma and rhinitis.  They occur in environments with high humidity wherever human skin is found.  Dust mites absorb humidity from the atmosphere (ie, they do not drink) and feed on organic matter (including shed human and animal skin).  Dust mites are a microscopic type of insect that you cannot see with the naked eye.  High levels of dust mites have been detected from mattresses, pillows, carpets, upholstered furniture, bed covers, clothes, soft toys and any woven material.  The principal allergen of the dust mite is found in its feces.  A gram of dust may contain 1,000 mites and 250,000 fecal particles.  Mite antigen is easily measured in the air during house cleaning activities.  Dust mites do not bite and do not cause harm to humans, other than by triggering allergies/asthma.    Ways to decrease your exposure to dust mites in your home:  1. Encase mattresses, box springs and pillows with a mite-impermeable barrier or cover   2. Wash sheets, blankets and drapes weekly in hot water (130 F) with detergent and dry them in a dryer on the hot setting.  3. Have the room cleaned frequently with a vacuum cleaner and a damp dust-mop.  For  carpeting or rugs, vacuuming with a vacuum cleaner equipped with a high-efficiency particulate air (HEPA) filter.  The dust mite allergic individual should not be in a room which is being cleaned and should wait 1 hour after cleaning before going into the room. 4. Do not sleep on upholstered furniture (eg, couches).    5. If possible removing carpeting, upholstered furniture and drapery from the home is ideal.  Horizontal blinds should be eliminated in the rooms where the person spends the most time (bedroom, study, television room).  Washable vinyl, roller-type shades are optimal. 6. Remove all non-washable stuffed toys from the bedroom.  Wash stuffed toys weekly like sheets and blankets above.   7. Reduce indoor humidity to less than 50%.  Inexpensive humidity monitors can be purchased at most hardware stores.  Do not use a humidifier as can make the problem worse and are not recommended.  Control of Dog or Cat Allergen  Avoidance is the best way to manage a dog or cat allergy. If you have a dog or cat and are allergic to dog or cats, consider removing the dog or cat from the home. If you have a dog or cat but don't want to find it a new home, or if your family wants a pet even though someone in the household is allergic, here are some strategies that may help keep symptoms at bay:  1. Keep the pet out of your bedroom and restrict it to only a few rooms. Be advised that keeping the dog or cat in only one room will not limit the allergens to that room. 2. Don't pet, hug or kiss the dog or cat; if you do, wash your hands with soap and water. 3. High-efficiency particulate air (HEPA) cleaners run continuously in a bedroom or living room can reduce allergen levels over time. 4. Regular use of a high-efficiency vacuum cleaner or a central vacuum can reduce allergen levels. 5. Giving your dog or cat a bath at least once a week can reduce airborne allergen.  Control of Mold Allergen   Mold and fungi can grow on a variety of surfaces provided certain temperature and moisture conditions exist.  Outdoor molds grow on plants, decaying vegetation and soil.  The major outdoor mold, Alternaria and Cladosporium, are found in very high numbers during hot and dry conditions.  Generally, a late Summer - Fall peak is seen for  common outdoor fungal spores.  Rain will temporarily lower outdoor mold spore count, but counts rise rapidly when the rainy period ends.  The most important indoor molds are Aspergillus and Penicillium.  Dark, humid and poorly ventilated basements are ideal sites for mold growth.  The next most common sites of mold growth are the bathroom and the kitchen.  Indoor (Perennial) Mold Control   Positive indoor molds via skin testing: Aspergillus and Penicillium  1. Maintain humidity below 50%. 2. Clean washable surfaces with 5% bleach solution. 3. Remove sources e.g. contaminated carpets.

## 2020-08-20 NOTE — Progress Notes (Signed)
NEW PATIENT  Date of Service/Encounter:  08/20/20  Referring provider: Sinda Du, MD   Assessment:   Perennial allergic rhinitis (indoor molds, dust mites, cat and dog)  Non specific irritation from mask  Plan/Recommendations:   1. Perennial allergic rhinitis - Testing today showed: indoor molds, dust mites, cat and dog - Copy of test results provided.  - Avoidance measures provided. - Start taking: Zyrtec (cetirizine) 10mg  tablet TWICE DAILY DURING FLARES - Use consistently for a few days of flares to control symptoms. - These  Medications are certainly SAFE to use on a daily basis, but obviously we want to only give you the least amount of medications to keep you comfortable. - You can use an extra dose of the antihistamine, if needed, for breakthrough symptoms.  - Consider nasal saline rinses 1-2 times daily to remove allergens from the nasal cavities. - I do not think we need allergy shots or anything more extreme at this point, but we can revisit that in the future if needed. - Tobacco smoke causes issues even in non-allergic people, since the smoke makes the cilia in your airways dysfunctional (cilia are microscopic finger-like projections that keep airways clear of dust etc. - Avoid cigarette exposure as much as you can.  2. Return in about 3 months (around 11/18/2020).   Subjective:   Lynn Hale is a 30 y.o. female presenting today for evaluation of  Chief Complaint  Patient presents with  . Allergy Testing    Lynn Hale has a history of the following: Patient Active Problem List   Diagnosis Date Noted  . 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 Family history of VSD (ventricular septal defect) 03/10/2015    History obtained from: chart review and patient.  [name] was referred by Sinda Du, MD.     Lynn Hale is a 30 y.o. female presenting for an evaluation of environmental allergies.  She reports that she has had  symptoms for a while. It varies according to where she is. When she is around her mother that smokes, she has issues with watery eyes and runny nose. She still has issues when she is not around her mother. This is not an every day thing. She does not use anything routinely. She has noticed that cats are definitely the worst. She can handle being around dogs, but she does not know necessarily whether dander is worse than others. She does not have cats but she would stay with people who have cats and she would always take a Benadryl.  She has had sinus infections but they are few and far between. She gets one per year or so, but they are not happening all of the time. She has no asthma at all. She has never been tested. She did grow up in [place].   She has tried Benadryl but this makes her sleepy. She has tried Claritin as well as Human resources officer. The last thing she used was Xyzal. She has tried [facility], but did not likethat much. She does use nasal saline rinses to make her nasal passages more moist.   Her daughter has a drug allergy. Talasia herself has issues with Tide which occurred when she was a kid. She is unsure of the details but thinks that she just broke out. She uses Purex now without a problem. She does report some issues with irritation on her face from the using the mask. She also brings up the idea that she is "inhaling too  much CO2" with the mask.    She eats everything without a problem, including all of the major food allergens.   Otherwise, there is no history of other atopic diseases, including asthma, food allergies, drug allergies, stinging insect allergies or contact dermatitis. There is no significant infectious history. Vaccinations are up to date.    Past Medical History: Patient Active Problem List   Diagnosis Date Noted  . 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 Family history of VSD (ventricular septal defect) 03/10/2015    Medication List:  Allergies as of 08/20/2020    No Known Allergies     Medication List       Accurate as of August 20, 2020 11:59 PM. If you have any questions, ask your nurse or doctor.        levocetirizine 5 MG tablet Commonly known as: XYZAL Take 5 mg by mouth daily.   melatonin 3 MG Tabs tablet Take 3 mg by mouth at bedtime.   norgestimate-ethinyl estradiol 0.25-35 MG-MCG tablet Commonly known as: Sprintec 28 Take 1 tablet by mouth daily.       Birth History: non-contributory  Developmental History: non-contributory  Past Surgical History: Past Surgical History:  Procedure Laterality Date  . CERVICAL ABLATION N/A 10/25/2018   Procedure: CERVICAL ABLATION(LASER ABLATION OF CERVIX);  Surgeon: Florian Buff, MD;  Location: [facility];  Service: Gynecology;  Laterality: N/A;  . WISDOM TOOTH EXTRACTION       Family History: Family History  Problem Relation Age of Onset  . Cancer Mother        cervical  . Diabetes Maternal Grandmother   . Heart disease Maternal Grandmother   . Glaucoma Paternal Grandmother   . Dementia Paternal Grandfather      Social History: [place]z lives at home with her family. She lives in an apartment that is 61-45 years old. There is carpeting throughout the home. They have electric heating and central cooling with fans. There are dogs outside of the home. There are no dust mite coverings on the bedding. There is no tobacco exposure in the home. She currently works as a Art therapist for the past three years. She is unsure about the use of a HEPA filter in the home. There is no fume, chemical, or dust exposure in the home.    Review of Systems  Constitutional: Negative.  Negative for fever, malaise/fatigue and weight loss.  HENT: Positive for congestion. Negative for ear discharge and ear pain.        Positive for postnasal drip.   Eyes: Negative for pain, discharge and redness.  Respiratory: Negative for cough, sputum production, shortness of breath and wheezing.   Cardiovascular:  Negative.  Negative for chest pain and palpitations.  Gastrointestinal: Negative for abdominal pain, heartburn, nausea and vomiting.  Skin: Negative.  Negative for itching and rash.  Neurological: Negative for dizziness and headaches.  Endo/Heme/Allergies: Negative for environmental allergies. Does not bruise/bleed easily.       Objective:   Blood pressure 102/74, pulse 82, temperature 97.7 F (36.5 C), temperature source Temporal, resp. rate 18, height 5' 4.57" (1.64 m), weight 181 lb 12.8 oz (82.5 kg), SpO2 98 %. Body mass index is 30.66 kg/m.   Physical Exam:   Physical Exam Constitutional:      Appearance: She is well-developed.  HENT:     Head: Normocephalic and atraumatic.     Right Ear: Tympanic membrane, ear canal and external ear normal. No drainage, swelling or tenderness.  Tympanic membrane is not injected, scarred, erythematous, retracted or bulging.     Left Ear: Tympanic membrane, ear canal and external ear normal. No drainage, swelling or tenderness. Tympanic membrane is not injected, scarred, erythematous, retracted or bulging.     Nose: No nasal deformity, septal deviation, mucosal edema, rhinorrhea or epistaxis.     Right Turbinates: Enlarged and swollen.     Left Turbinates: Enlarged and swollen.     Right Sinus: No maxillary sinus tenderness or frontal sinus tenderness.     Left Sinus: No maxillary sinus tenderness or frontal sinus tenderness.     Mouth/Throat:     Mouth: Oropharynx is clear and moist. Mucous membranes are not pale and not dry.     Pharynx: Uvula midline.  Eyes:     General:        Right eye: No discharge.        Left eye: No discharge.     Extraocular Movements: EOM normal.     Conjunctiva/sclera: Conjunctivae normal.     Right eye: Right conjunctiva is not injected. No chemosis.    Left eye: Left conjunctiva is not injected. No chemosis.    Pupils: Pupils are equal, round, and reactive to light.  Cardiovascular:     Rate and Rhythm:  Normal rate and regular rhythm.     Heart sounds: Normal heart sounds.  Pulmonary:     Effort: Pulmonary effort is normal. No tachypnea, accessory muscle usage or respiratory distress.     Breath sounds: Normal breath sounds. No wheezing, rhonchi or rales.     Comments: Moving air well in all lung fields. No increased work of breathing noted.  Chest:     Chest wall: No tenderness.  Abdominal:     Tenderness: There is no abdominal tenderness. There is no guarding or rebound.  Lymphadenopathy:     Head:     Right side of head: No submandibular, tonsillar or occipital adenopathy.     Left side of head: No submandibular, tonsillar or occipital adenopathy.     Cervical: No cervical adenopathy.  Skin:    General: Skin is warm.     Capillary Refill: Capillary refill takes less than 2 seconds.     Coloration: Skin is not pale.     Findings: No abrasion, erythema, petechiae or rash. Rash is not papular, urticarial or vesicular.     Comments: No eczematous or urticarial lesions noted. She does have some irritation where her mask is, but no scaling, oozing, or blistering noted.   Neurological:     Mental Status: She is alert.  Psychiatric:        Mood and Affect: Mood and affect normal.      Diagnostic studies:     Allergy Studies:   Environmental allergy panel: positive to dust mites, cat, and dog with adequate controls    Allergy testing results were read and interpreted by myself, documented by clinical staff.         Salvatore Marvel, MD [facility] [place]

## 2020-08-24 ENCOUNTER — Encounter: Payer: Self-pay | Admitting: Allergy & Immunology

## 2020-11-08 ENCOUNTER — Ambulatory Visit
Admission: EM | Admit: 2020-11-08 | Discharge: 2020-11-08 | Disposition: A | Discharge status: Home / Self Care | Payer: 59 | Attending: Family Medicine | Admitting: Family Medicine

## 2020-11-08 ENCOUNTER — Other Ambulatory Visit: Payer: Self-pay

## 2020-11-08 ENCOUNTER — Encounter: Payer: Self-pay | Admitting: Emergency Medicine

## 2020-11-08 DIAGNOSIS — H60392 Other infective otitis externa, left ear: Secondary | ICD-10-CM

## 2020-11-08 MED ORDER — CIPRO HC 0.2-1 % OT SUSP: 3.0000 [drp] | Freq: Two times a day (BID) | OTIC | 0 refills | Status: AC

## 2020-11-08 NOTE — ED Provider Notes (Signed)
[facility]    CSN: [id number] Arrival date & time: 11/08/20  1610      History   Chief Complaint Chief Complaint  Patient presents with  . Ear Pain    HPI Lynn Hale is a 30 y.o. female.   HPI Patient presents for evaluation of left ear pain x today. Pain has progressed through the day, left inner ear is sensitive to cool air, and pain is throbbing. Endorses sensation of wet discharge in ear. No drainage. No other uri symptoms or fever.  Past Medical History:  Diagnosis Date  . Medical history non-contributory   . Pregnant 02/10/2015  . Vaginal bleeding in pregnancy 02/17/2015  . Vaginal Pap smear, abnormal     Patient Active Problem List   Diagnosis Date Noted  . 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 Family history of VSD (ventricular septal defect) 03/10/2015    Past Surgical History:  Procedure Laterality Date  . CERVICAL ABLATION N/A 10/25/2018   Procedure: CERVICAL ABLATION(LASER ABLATION OF CERVIX);  Surgeon: Florian Buff, MD;  Location: [facility];  Service: Gynecology;  Laterality: N/A;  . WISDOM TOOTH EXTRACTION      OB History    Gravida  1   Para  1   Term  1   Preterm      AB      Living  1     SAB      IAB      Ectopic      Multiple  0   Live Births  1            Home Medications    Prior to Admission medications   Medication Sig Start Date End Date Taking? Authorizing Provider  ciprofloxacin-hydrocortisone (CIPRO HC) OTIC suspension Place 3 drops into the left ear 2 (two) times daily for 7 days. 11/08/20 11/15/20 Yes Scot Jun, FNP  levocetirizine (XYZAL) 5 MG tablet Take 5 mg by mouth daily. 05/19/20   [provider]  Melatonin 3 MG TABS Take 3 mg by mouth at bedtime.    [provider]  norgestimate-ethinyl estradiol ([place]edom 28) 0.25-35 MG-MCG tablet Take 1 tablet by mouth daily. 11/27/19   Florian Buff, MD    Family History Family History  Problem Relation Age of Onset   . Cancer Mother        cervical  . Diabetes Maternal Grandmother   . Heart disease Maternal Grandmother   . Glaucoma Paternal Grandmother   . Dementia Paternal Grandfather     Social History Social History   Tobacco Use  . Smoking status: Passive Smoke Exposure - Never Smoker  . Smokeless tobacco: Never Used  Vaping Use  . Vaping Use: Never used  Substance Use Topics  . Alcohol use: Yes    Comment: occ  . Drug use: No     Allergies   Patient has no known allergies.   Review of Systems Review of Systems Pertinent negatives listed in HPI  Physical Exam Triage Vital Signs ED Triage Vitals  Enc Vitals Group     BP 11/08/20 1710 113/74     Pulse Rate 11/08/20 1710 75     Resp 11/08/20 1710 19     Temp 11/08/20 1710 98.1 F (36.7 C)     Temp Source 11/08/20 1710 Oral     SpO2 11/08/20 1710 97 %     Weight --      Height --  Head Circumference --      Peak Flow --      Pain Score 11/08/20 1709 3     Pain Loc --      Pain Edu? --      Excl. in [place]? --    No data found.  Updated Vital Signs BP 113/74 (BP Location: Right Arm)   Pulse 75   Temp 98.1 F (36.7 C) (Oral)   Resp 19   SpO2 97%   Visual Acuity Right Eye Distance:   Left Eye Distance:   Bilateral Distance:    Right Eye Near:   Left Eye Near:    Bilateral Near:     Physical Exam Constitutional:      Appearance: She is not ill-appearing.  HENT:     Left Ear: Tympanic membrane normal. Swelling and tenderness present.     Ears:     Comments: Canal erythema, swelling,  and tenderness present. TM normal without rupture  Cardiovascular:     Rate and Rhythm: Normal rate and regular rhythm.  Pulmonary:     Effort: Pulmonary effort is normal.     Breath sounds: Normal breath sounds and air entry.  Neurological:     General: No focal deficit present.     Mental Status: She is alert and oriented to person, place, and time.  Psychiatric:        Mood and Affect: Mood normal.         Behavior: Behavior normal.        Thought Content: Thought content normal.        Judgment: Judgment normal.      UC Treatments / Results  Labs (all labs ordered are listed, but only abnormal results are displayed) Labs Reviewed - No data to display  EKG   Radiology No results found.  Procedures Procedures (including critical care time)  Medications Ordered in UC Medications - No data to display  Initial Impression / Assessment and Plan / UC Course  I have reviewed the triage vital signs and the nursing notes.  Pertinent labs & imaging results that were available during my care of the patient were reviewed by me and considered in my medical decision making (see chart for details).    Left otitis externa treated with Cipro HC 3 drops in left ear 2 times daily for total 7 days.  Complete entire course of medication.  Return precautions given. Final Clinical Impressions(s) / UC Diagnoses   Final diagnoses:  Infective otitis externa of left ear   Discharge Instructions   None    ED Prescriptions    Medication Sig Dispense Auth. Provider   ciprofloxacin-hydrocortisone (CIPRO HC) OTIC suspension Place 3 drops into the left ear 2 (two) times daily for 7 days. 10 mL Scot Jun, FNP     PDMP not reviewed this encounter.   Scot Jun, [place] 11/08/20 1755

## 2020-11-08 NOTE — ED Triage Notes (Signed)
Pain to LT ear that started today

## 2020-11-11 ENCOUNTER — Other Ambulatory Visit: Payer: Self-pay | Admitting: Obstetrics & Gynecology

## 2020-12-10 ENCOUNTER — Other Ambulatory Visit: Payer: Self-pay

## 2020-12-10 ENCOUNTER — Ambulatory Visit: Payer: 59 | Admitting: Allergy & Immunology

## 2020-12-10 ENCOUNTER — Encounter: Payer: Self-pay | Admitting: Allergy & Immunology

## 2020-12-10 DIAGNOSIS — J3089 Other allergic rhinitis: Secondary | ICD-10-CM | POA: Diagnosis not present

## 2020-12-10 MED ORDER — LEVOCETIRIZINE DIHYDROCHLORIDE 5 MG PO TABS: 5.0000 mg | ORAL_TABLET | Freq: Every day | ORAL | 3 refills | Status: DC

## 2020-12-10 NOTE — Patient Instructions (Addendum)
1. Perennial allergic rhinitis (indoor molds, dust mites, cat and dog) - Continue taking: Zyrtec (cetirizine) 10mg  tablet TWICE DAILY DURING FLARES - Continue to take daily to stay ahead of your symptoms to make things easier to tolerate.  - It sounds like you are in a good spot.   2. Return in about 1 year (around 12/10/2021).    Please inform us of any Emergency Department visits, hospitalizations, or changes in symptoms. Call us before going to the ED for breathing or allergy symptoms since we might be able to fit you in for a sick visit. Feel free to contact us anytime with any questions, problems, or concerns.  It was a pleasure to see you again today!  Websites that have reliable patient information: 1. American Academy of Asthma, Allergy, and Immunology: www.aaaai.org 2. Food Allergy Research and Education (FARE): foodallergy.org 3. Mothers of Asthmatics: http://www.asthmacommunitynetwork.org 4. American College of Allergy, Asthma, and Immunology: www.acaai.org   COVID-19 Vaccine Information can be found at: ShippingScam.co.uk For questions related to vaccine distribution or appointments, please email [email or web address] or call [phone].   We realize that you might be concerned about having an allergic reaction to the COVID19 vaccines. To help with that concern, WE ARE OFFERING THE COVID19 VACCINES IN OUR OFFICE! Ask the front desk for dates!     "Like" [place] on Facebook and Instagram for our latest updates!      A healthy democracy works best when [employer] participate! Make sure you are registered to vote! If you have moved or changed any of your contact information, you will need to get this updated before voting!  In some cases, you MAY be able to register to vote online: [email or web address]

## 2020-12-10 NOTE — Progress Notes (Signed)
FOLLOW UP  Date of Service/Encounter:  12/13/20   Assessment:   Perennial allergic rhinitis (indoor molds, dust mites, cat and dog)  Non specific irritation from mask  Plan/Recommendations:   1. Perennial allergic rhinitis (indoor molds, dust mites, cat and dog) - Continue taking: Zyrtec (cetirizine) 10mg  tablet TWICE DAILY DURING FLARES - Continue to take daily to stay ahead of your symptoms to make things easier to tolerate.  - It sounds like you are in a good spot.   2. Return in about 1 year (around 12/10/2021).    Subjective:   Lynn Hale is a 30 y.o. female presenting today for follow up of  Chief Complaint  Patient presents with  . Follow-up    Lynn Hale has a history of the following: Patient Active Problem List   Diagnosis Date Noted  . 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 Family history of VSD (ventricular septal defect) 03/10/2015    History obtained from: chart review and patient.  Lynn Hale is a 30 y.o. female presenting for a follow up visit.  She was last seen as a new patient in January 2022.  At that time, she underwent testing that was positive to indoor mold, dust mite, cat, and dog.  We started her on Zyrtec 10 mg up to twice daily during flares.  Recommended using this consistently for several days to control her symptoms.  She did not want to use anything every day, so we compromised with her to use only during flares.  She did have some bad days last week when she was taking levocetirizine up to twice daily (she was finishing this up). Then she would take Benadryl at night. She did not need antibiotics and did not need steroids. She has not needed steroids at all. She has not been on antibiotics at all.  She is having some new exposures. They are using a new antimicrobial at work that is supposed to kill COVID19 more effectively. She is also moving out of her apartment in the near future and anticipate that there is going to  be a lot more dust exposure. She is willing to take an extra antihistamine during those times if needed.   Otherwise, there have been no changes to her past medical history, surgical history, family history, or social history.    Review of Systems  Constitutional: Negative.  Negative for chills, fever, malaise/fatigue and weight loss.  HENT: Positive for congestion. Negative for ear discharge, ear pain and sinus pain.   Eyes: Negative for pain, discharge and redness.  Respiratory: Negative for cough, sputum production, shortness of breath and wheezing.   Cardiovascular: Negative.  Negative for chest pain and palpitations.  Gastrointestinal: Negative for abdominal pain, heartburn, nausea and vomiting.  Skin: Negative.  Negative for itching and rash.  Neurological: Negative for dizziness and headaches.  Endo/Heme/Allergies: Negative for environmental allergies. Does not bruise/bleed easily.       Objective:   Vitals signs reviewed and were normal.    Physical Exam:  Physical Exam Constitutional:      Appearance: She is well-developed.  HENT:     Head: Normocephalic and atraumatic.     Right Ear: Tympanic membrane, ear canal and external ear normal.     Left Ear: Tympanic membrane, ear canal and external ear normal.     Nose: No nasal deformity, septal deviation, mucosal edema or rhinorrhea.     Right Turbinates: Enlarged. Not swollen or pale.  Left Turbinates: Enlarged. Not swollen or pale.     Right Sinus: No maxillary sinus tenderness or frontal sinus tenderness.     Left Sinus: No maxillary sinus tenderness or frontal sinus tenderness.     Mouth/Throat:     Mouth: Mucous membranes are not pale and not dry.     Pharynx: Uvula midline.  Eyes:     General:        Right eye: No discharge.        Left eye: No discharge.     Conjunctiva/sclera: Conjunctivae normal.     Right eye: Right conjunctiva is not injected. No chemosis.    Left eye: Left conjunctiva is not  injected. No chemosis.    Pupils: Pupils are equal, round, and reactive to light.  Cardiovascular:     Rate and Rhythm: Normal rate and regular rhythm.     Heart sounds: Normal heart sounds.  Pulmonary:     Effort: Pulmonary effort is normal. No tachypnea, accessory muscle usage or respiratory distress.     Breath sounds: Normal breath sounds. No wheezing, rhonchi or rales.  Chest:     Chest wall: No tenderness.  Lymphadenopathy:     Cervical: No cervical adenopathy.  Skin:    Coloration: Skin is not pale.     Findings: No abrasion, erythema, petechiae or rash. Rash is not papular, urticarial or vesicular.  Neurological:     Mental Status: She is alert.      Diagnostic studies: none     Salvatore Marvel, MD  [facility] and [facility]

## 2021-11-24 ENCOUNTER — Other Ambulatory Visit: Payer: 59 | Admitting: Women's Health

## 2021-12-22 ENCOUNTER — Other Ambulatory Visit: Payer: 59 | Admitting: Obstetrics & Gynecology

## 2022-06-14 ENCOUNTER — Encounter (HOSPITAL_COMMUNITY): Payer: Self-pay

## 2022-06-14 ENCOUNTER — Emergency Department (HOSPITAL_COMMUNITY)
Admission: EM | Admit: 2022-06-14 | Discharge: 2022-06-14 | Disposition: A | Discharge status: Home / Self Care | Payer: 59 | Attending: Emergency Medicine | Admitting: Emergency Medicine

## 2022-06-14 ENCOUNTER — Emergency Department (HOSPITAL_COMMUNITY): Payer: 59

## 2022-06-14 ENCOUNTER — Other Ambulatory Visit: Payer: Self-pay

## 2022-06-14 DIAGNOSIS — N9489 Other specified conditions associated with female genital organs and menstrual cycle: Secondary | ICD-10-CM | POA: Diagnosis not present

## 2022-06-14 DIAGNOSIS — R109 Unspecified abdominal pain: Secondary | ICD-10-CM | POA: Diagnosis present

## 2022-06-14 DIAGNOSIS — R1030 Lower abdominal pain, unspecified: Secondary | ICD-10-CM | POA: Diagnosis not present

## 2022-06-14 DIAGNOSIS — N83201 Unspecified ovarian cyst, right side: Secondary | ICD-10-CM

## 2022-06-14 LAB — URINALYSIS, ROUTINE W REFLEX MICROSCOPIC
Bilirubin Urine: NEGATIVE
Glucose, UA: NEGATIVE mg/dL
Hgb urine dipstick: NEGATIVE
Ketones, ur: NEGATIVE mg/dL
Leukocytes,Ua: NEGATIVE
Nitrite: NEGATIVE
Protein, ur: NEGATIVE mg/dL
Specific Gravity, Urine: 1.009 (ref 1.005–1.030)
pH: 6 (ref 5.0–8.0)

## 2022-06-14 LAB — BASIC METABOLIC PANEL
Anion gap: 6 (ref 5–15)
BUN: 12 mg/dL (ref 6–20)
CO2: 25 mmol/L (ref 22–32)
Calcium: 8.8 mg/dL — ABNORMAL LOW (ref 8.9–10.3)
Chloride: 106 mmol/L (ref 98–111)
Creatinine, Ser: 0.77 mg/dL (ref 0.44–1.00)
GFR, Estimated: >60 mL/min (ref >60)
Glucose, Bld: 110 mg/dL — ABNORMAL HIGH (ref 70–99)
Potassium: 3.8 mmol/L (ref 3.5–5.1)
Sodium: 137 mmol/L (ref 135–145)

## 2022-06-14 LAB — CBC WITH DIFFERENTIAL/PLATELET
Abs Immature Granulocytes: 0.01 10*3/uL (ref 0.00–0.07)
Basophils Absolute: 0.1 10*3/uL (ref 0.0–0.1)
Basophils Relative: 1 %
Eosinophils Absolute: 0.4 10*3/uL (ref 0.0–0.5)
Eosinophils Relative: 5 %
HCT: 39.6 % (ref 36.0–46.0)
Hemoglobin: 13.6 g/dL (ref 12.0–15.0)
Immature Granulocytes: 0 %
Lymphocytes Relative: 36 %
Lymphs Abs: 2.6 10*3/uL (ref 0.7–4.0)
MCH: 32 pg (ref 26.0–34.0)
MCHC: 34.3 g/dL (ref 30.0–36.0)
MCV: 93.2 fL (ref 80.0–100.0)
Monocytes Absolute: 0.6 10*3/uL (ref 0.1–1.0)
Monocytes Relative: 8 %
Neutro Abs: 3.6 10*3/uL (ref 1.7–7.7)
Neutrophils Relative %: 50 %
Platelets: 269 10*3/uL (ref 150–400)
RBC: 4.25 MIL/uL (ref 3.87–5.11)
RDW: 12.2 % (ref 11.5–15.5)
WBC: 7.2 10*3/uL (ref 4.0–10.5)
nRBC: 0 % (ref 0.0–0.2)

## 2022-06-14 LAB — HCG, SERUM, QUALITATIVE: Preg, Serum: NEGATIVE

## 2022-06-14 LAB — POC URINE PREG, ED: Preg Test, Ur: NEGATIVE

## 2022-06-14 MED ORDER — SODIUM CHLORIDE 0.9 % IV BOLUS
1000.0000 mL | Freq: Once | INTRAVENOUS | Status: AC
  Administered 2022-06-14: 1000 mL via INTRAVENOUS

## 2022-06-14 MED ORDER — MORPHINE SULFATE (PF) 4 MG/ML IV SOLN
4.0000 mg | Freq: Once | INTRAVENOUS | Status: AC
  Administered 2022-06-14: 4 mg via INTRAVENOUS
  Filled 2022-06-14: qty 1

## 2022-06-14 MED ORDER — ONDANSETRON HCL 4 MG/2ML IJ SOLN
4.0000 mg | Freq: Once | INTRAMUSCULAR | Status: AC
  Administered 2022-06-14: 4 mg via INTRAVENOUS
  Filled 2022-06-14: qty 2

## 2022-06-14 NOTE — ED Provider Notes (Signed)
Care transferred to me.  WBC and other labs unremarkable on my interpretation.  Has current mild right lower quadrant tenderness, more medial than McBurney's point.  Sudden onset of pain around 5 AM. Pregnancy test is negative.  Urinalysis unremarkable.  Ultrasound of her pelvis shows a right hemorrhagic cyst with a little bit of free fluid.  Radiology notes some mild free fluid near left ovary, though on my interpretation of images it looks right sided, where the cyst is. This all seems consistent with her presentation. No torsion. I doubt concomitant appendicitis or other acute abdominal/pelvic emergency.  She is feeling better.  Will recommend NSAIDs and follow-up with OB/GYN.  Given return precautions.   Sherwood Gambler, MD 06/14/22 1031

## 2022-06-14 NOTE — ED Triage Notes (Signed)
Pt presents with lower abdominal pain that started this morning. No n/v/d. History of cysts.

## 2022-06-14 NOTE — Discharge Instructions (Addendum)
Your work-up today shows what is called a hemorrhagic cyst on the right ovary.  This is likely the cause of your pain.  Take anti-inflammatories such as ibuprofen, Advil, or Aleve.  You may take Tylenol in addition to 1 of those.  If you develop fever, new or worsening pain, vomiting, or any other new/concerning symptoms then return to the ER for evaluation.

## 2022-06-14 NOTE — ED Provider Notes (Signed)
[facility] EMERGENCY DEPARTMENT Provider Note   CSN: [id number] Arrival date & time: 06/14/22  0605     History {Add pertinent medical, surgical, social history, OB history to HPI:1} Chief Complaint  Patient presents with   Abdominal Pain    Lynn Hale is a 31 y.o. female.  Patient is a 31 year old female with history of ovarian cysts.  Patient presenting today for lower abdominal pain.  This started several hours prior to presentation.  She describes pain across her lower abdomen she describes as a cramping.  This is not associated with any diarrhea, constipation, urinary complaints, vaginal bleeding, or vaginal discharge.  Last menstrual period was 2 weeks ago.  This feels different than prior cyst on her ovary.  The history is provided by the patient.       Home Medications Prior to Admission medications   Medication Sig Start Date End Date Taking? Authorizing Provider  levocetirizine (XYZAL) 5 MG tablet Take 1 tablet (5 mg total) by mouth daily. 12/10/20 03/10/21  Valentina Shaggy, MD  Melatonin 3 MG TABS Take 3 mg by mouth at bedtime.    [provider]  Echo 28 0.25-35 MG-MCG tablet TAKE ONE TABLET BY MOUTH ONCE DAILY. 11/11/20   Florian Buff, MD      Allergies    Patient has no known allergies.    Review of Systems   Review of Systems  All other systems reviewed and are negative.   Physical Exam Updated Vital Signs BP 91/76   Pulse 85   Temp 97.6 F (36.4 C)   Resp 18   Ht '5\' 4"'$  (1.626 m)   Wt 93 kg   SpO2 96%   BMI 35.19 kg/m  Physical Exam Vitals and nursing note reviewed.  Constitutional:      General: She is not in acute distress.    Appearance: She is well-developed. She is not diaphoretic.  HENT:     Head: Normocephalic and atraumatic.  Cardiovascular:     Rate and Rhythm: Normal rate and regular rhythm.     Heart sounds: No murmur heard.    No friction rub. No gallop.  Pulmonary:     Effort: Pulmonary effort is  normal. No respiratory distress.     Breath sounds: Normal breath sounds. No wheezing.  Abdominal:     General: Bowel sounds are normal. There is no distension.     Palpations: Abdomen is soft.     Tenderness: There is abdominal tenderness in the right lower quadrant and suprapubic area. There is no right CVA tenderness, left CVA tenderness, guarding or rebound.  Musculoskeletal:        General: Normal range of motion.     Cervical back: Normal range of motion and neck supple.  Skin:    General: Skin is warm and dry.  Neurological:     General: No focal deficit present.     Mental Status: She is alert and oriented to person, place, and time.     ED Results / Procedures / Treatments   Labs (all labs ordered are listed, but only abnormal results are displayed) Labs Reviewed - No data to display  EKG None  Radiology No results found.  Procedures Procedures  {Document cardiac monitor, telemetry assessment procedure when appropriate:1}  Medications Ordered in ED Medications  ondansetron (ZOFRAN) injection 4 mg (has no administration in time range)  morphine (PF) 4 MG/ML injection 4 mg (has no administration in time range)  sodium chloride 0.9 %  bolus 1,000 mL (has no administration in time range)    ED Course/ Medical Decision Making/ A&P                           Medical Decision Making Amount and/or Complexity of Data Reviewed Labs: ordered.  Risk Prescription drug management.   ***  {Document critical care time when appropriate:1} {Document review of labs and clinical decision tools ie heart score, Chads2Vasc2 etc:1}  {Document your independent review of radiology images, and any outside records:1} {Document your discussion with family members, caretakers, and with consultants:1} {Document social determinants of health affecting pt's care:1} {Document your decision making why or why not admission, treatments were needed:1} Final Clinical Impression(s) / ED  Diagnoses Final diagnoses:  None    Rx / DC Orders ED Discharge Orders     None

## 2022-06-29 ENCOUNTER — Ambulatory Visit: Payer: 59 | Admitting: Adult Health

## 2022-06-29 ENCOUNTER — Encounter: Payer: Self-pay | Admitting: Adult Health

## 2022-06-29 VITALS — BP 117/73 | HR 77 | Ht 64.0 in | Wt 206.0 lb

## 2022-06-29 DIAGNOSIS — Z8742 Personal history of other diseases of the female genital tract: Secondary | ICD-10-CM | POA: Diagnosis not present

## 2022-06-29 NOTE — Progress Notes (Signed)
  Subjective:     Patient ID: Lynn Hale, female   DOB: [birth year], 31 y.o.   MRN: [id number]  HPI Lynn Hale is a 31 year old white female,married, G1P1001, in for ER follow up on right ovarian hemorrhagia cyst, had pain. She is sp laser ablation of cervix in March 2020.  Last pap was 11/27/19, negative HPV and malignancy   PCP is Dr Nevada Crane  Review of Systems had pain seen in ER 06/14/22, US showed  right hemorrhagic cyst No pain now Reviewed past medical,surgical, social and family history. Reviewed medications and allergies.     Objective:   Physical Exam BP 117/73 (BP Location: Right Arm, Patient Position: Sitting, Cuff Size: Normal)   Pulse 77   Ht '5\' 4"'$  (1.626 m)   Wt 206 lb (93.4 kg)   LMP 06/21/2022   BMI 35.36 kg/m     Skin warm and dry.Pelvic: external genitalia is normal in appearance no lesions, vagina: pink,urethra has no lesions or masses noted, cervix:smooth and bulbous, uterus: normal size, shape and contour, non tender, no masses felt, adnexa: no masses or tenderness noted. Bladder is non tender and no masses felt.  [place] reviewed: FINDINGS: Uterus   Measurements: 7.8 x 4.4 x 5.8 cm = volume: 105 mL. No fibroids or other mass visualized.   Endometrium   Thickness: 13 mm.  No focal abnormality visualized.   Right ovary   Measurements: 3.7 x 2.8 x 2.9 cm = volume: 15.6 mL. Multiple follicles with additional small 2.0 cm complex cystic lesion, likely a hemorrhagic cyst. No follow-up imaging is recommended.   Left ovary   Measurements: 4.7 x 2.7 x 4.1 cm = volume: 27.7 mL. Normal appearance/no adnexal mass. Corpus luteum noted.   Pulsed Doppler evaluation of both ovaries demonstrates normal low-resistance arterial and venous waveforms.   Other findings Small amount of free fluid around the left ovary.   IMPRESSION: 1. No acute abnormality. 2. Small amount of free fluid around the left ovary, nonspecific.   Fall risk is low    06/29/2022    2:46 PM  11/27/2019    2:57 PM 07/24/2018    8:41 AM  Depression screen PHQ 2/9  Decreased Interest 0 0 0  Down, Depressed, Hopeless 0 0 0  PHQ - 2 Score 0 0 0  Altered sleeping  2   Tired, decreased energy  1   Change in appetite  0   Feeling bad or failure about yourself   0   Trouble concentrating  0   Moving slowly or fidgety/restless  0   Suicidal thoughts  0   PHQ-9 Score  3    Examination chaperoned by Dewitt Hoes RN  Assessment:      1. History of ovarian cyst No pain now Has had period since ER visit       Plan:     Return in 4 weeks for pap and physical with me

## 2022-08-25 ENCOUNTER — Encounter: Payer: Self-pay | Admitting: Adult Health

## 2022-08-25 ENCOUNTER — Ambulatory Visit: Payer: 59 | Admitting: Adult Health

## 2022-08-25 ENCOUNTER — Other Ambulatory Visit (HOSPITAL_COMMUNITY)
Admission: RE | Admit: 2022-08-25 | Discharge: 2022-08-25 | Disposition: A | Discharge status: Home / Self Care | Payer: 59 | Source: Ambulatory Visit | Attending: Adult Health | Admitting: Adult Health

## 2022-08-25 ENCOUNTER — Ambulatory Visit: Payer: 59 | Admitting: Allergy & Immunology

## 2022-08-25 ENCOUNTER — Ambulatory Visit (INDEPENDENT_AMBULATORY_CARE_PROVIDER_SITE_OTHER): Payer: 59 | Admitting: Adult Health

## 2022-08-25 ENCOUNTER — Encounter: Payer: Self-pay | Admitting: Allergy & Immunology

## 2022-08-25 VITALS — BP 117/79 | HR 84 | Ht 64.0 in | Wt 210.5 lb

## 2022-08-25 VITALS — BP 118/70 | HR 104 | Temp 98.3°F | Resp 18 | Ht 64.0 in | Wt 209.2 lb

## 2022-08-25 DIAGNOSIS — Z01419 Encounter for gynecological examination (general) (routine) without abnormal findings: Secondary | ICD-10-CM

## 2022-08-25 DIAGNOSIS — J3089 Other allergic rhinitis: Secondary | ICD-10-CM

## 2022-08-25 DIAGNOSIS — T7840XD Allergy, unspecified, subsequent encounter: Secondary | ICD-10-CM

## 2022-08-25 DIAGNOSIS — L299 Pruritus, unspecified: Secondary | ICD-10-CM

## 2022-08-25 MED ORDER — LEVOCETIRIZINE DIHYDROCHLORIDE 5 MG PO TABS: 5.0000 mg | ORAL_TABLET | Freq: Two times a day (BID) | ORAL | 1 refills | Status: DC

## 2022-08-25 NOTE — Progress Notes (Signed)
Patient ID: Lynn Hale, female   DOB: 08-05-1991, 32 y.o.   MRN: [id number] History of Present Illness: Katlynne is a 32 year old white female, married, G1P1001, in for a well woman gyn exam and pap. She saw allergist today too.  PCP is Dr Nevada Crane.   Current Medications, Allergies, Past Medical History, Past Surgical History, Family History and Social History were reviewed in [employer] record.     Review of Systems: Patient denies any headaches, hearing loss, fatigue, blurred vision, shortness of breath, chest pain, abdominal pain, problems with bowel movements, urination, or intercourse. No joint pain or mood swings.     Physical Exam:BP 117/79 (BP Location: Left Arm, Patient Position: Sitting, Cuff Size: Normal)   Pulse 84   Ht '5\' 4"'$  (1.626 m)   Wt 210 lb 8 oz (95.5 kg)   BMI 36.13 kg/m   General:  Well developed, well nourished, no acute distress Skin:  Warm and dry Neck:  Midline trachea, normal thyroid, good ROM, no lymphadenopathy Lungs; Clear to auscultation bilaterally Breast:  No dominant palpable mass, retraction, or nipple discharge Cardiovascular: Regular rate and rhythm Abdomen:  Soft, non tender, no hepatosplenomegaly Pelvic:  External genitalia is normal in appearance, no lesions.  The vagina is normal in appearance. Urethra has no lesions or masses. The cervix is bulbous,everted at os and friable with EC brush,pap with HR HPV genotyping performed.  Uterus is felt to be normal size, shape, and contour.  No adnexal masses or tenderness noted.Bladder is non tender, no masses felt. Extremities/musculoskeletal:  No swelling or varicosities noted, no clubbing or cyanosis Psych:  No mood changes, alert and cooperative,seems happy AA is 1 Fall risk is low    08/25/2022   12:49 PM 06/29/2022    2:46 PM 11/27/2019    2:57 PM  Depression screen PHQ 2/9  Decreased Interest 0 0 0  Down, Depressed, Hopeless 0 0 0  PHQ - 2 Score 0 0 0  Altered  sleeping 1  2  Tired, decreased energy 1  1  Change in appetite 0  0  Feeling bad or failure about yourself  0  0  Trouble concentrating 0  0  Moving slowly or fidgety/restless 0  0  Suicidal thoughts 0  0  PHQ-9 Score 2  3       08/25/2022   12:49 PM 11/27/2019    2:59 PM  GAD 7 : Generalized Anxiety Score  Nervous, Anxious, on Edge 0 0  Control/stop worrying 0 0  Worry too much - different things 0 0  Trouble relaxing 0 0  Restless 0 0  Easily annoyed or irritable 0 0  Afraid - awful might happen 0 0  Total GAD 7 Score 0 0    Upstream - 08/25/22 1248       Pregnancy Intention Screening   Does the patient want to become pregnant in the next year? No    Does the patient's partner want to become pregnant in the next year? No    Would the patient like to discuss contraceptive options today? No      Contraception Wrap Up   Current Method Female Condom    End Method Female Condom              Examination chaperoned by Levy Pupa LPN   Impression and Plan: 1. Encounter for gynecological examination with Papanicolaou smear of cervix Pap sent Pap in 3 years if normal Physical in 1 year

## 2022-08-25 NOTE — Patient Instructions (Addendum)
1. Perennial allergic rhinitis (indoor molds, dust mites, cat and dog) - Continue taking: Xyzal (levocetirizine) '5mg'$  daily  - DOUBLE the Xyzal when you have flares in the future. - Call us when this happens. - We are going to get some labs to rule out other weird causes of allergic reactions. - I doubt this is the case, but we want to make sure.  - We will call you in 1-2 weeks with the results of the testing.   2. Return in about 3 months (around 11/24/2022).    Please inform us of any Emergency Department visits, hospitalizations, or changes in symptoms. Call us before going to the ED for breathing or allergy symptoms since we might be able to fit you in for a sick visit. Feel free to contact us anytime with any questions, problems, or concerns.  It was a pleasure to see you again today!  Websites that have reliable patient information: 1. American Academy of Asthma, Allergy, and Immunology: www.aaaai.org 2. Food Allergy Research and Education (FARE): foodallergy.org 3. Mothers of Asthmatics: http://www.asthmacommunitynetwork.org 4. American College of Allergy, Asthma, and Immunology: www.acaai.org   COVID-19 Vaccine Information can be found at: ShippingScam.co.uk For questions related to vaccine distribution or appointments, please email [email or web address] or call [phone].   We realize that you might be concerned about having an allergic reaction to the COVID19 vaccines. To help with that concern, WE ARE OFFERING THE COVID19 VACCINES IN OUR OFFICE! Ask the front desk for dates!     "Like" [place] on Facebook and Instagram for our latest updates!      A healthy democracy works best when [employer] participate! Make sure you are registered to vote! If you have moved or changed any of your contact information, you will need to get this updated before voting!  In some cases, you MAY be able to register to vote online:  [email or web address]

## 2022-08-25 NOTE — Progress Notes (Signed)
FOLLOW UP  Date of Service/Encounter:  08/25/22   Assessment:   Perennial allergic rhinitis (indoor molds, dust mites, cat and dog)  Allergic reactions - unknown trigger   I am unsure what she is reeacting to at this point. She has had the dog for a couple of years without a problem, but I suppose that could be the culprit. She denies any encounters with stinging insects. We are going to get some testing to see if she has alpha gal syndrome. We are also going to check for mast cell disease with a tryptase.    Plan/Recommendations:   1. Perennial allergic rhinitis (indoor molds, dust mites, cat and dog) - Continue taking: Xyzal (levocetirizine) '5mg'$  daily  - DOUBLE the Xyzal when you have flares in the future. - Call us when this happens. - We are going to get some labs to rule out other weird causes of allergic reactions. - I doubt this is the case, but we want to make sure.  - We will call you in 1-2 weeks with the results of the testing.   2. Return in about 3 months (around 11/24/2022).   Subjective:   Lynn Hale is a 32 y.o. female presenting today for follow up of  Chief Complaint  Patient presents with   Allergic Rhinitis     Has been taking zyrtec daily and has been having allergy flares a lot more. Had about 3 in the past month.  Sneezing, runny nose    Pruritus    Has had her hands and feet itching that comes and goes that started this past Saturday.     Lynn Hale has a history of the following: Patient Active Problem List   Diagnosis Date Noted   History of ovarian cyst 06/29/2022   LGSIL on Pap smear of cervix 07/30/2018   Family history of VSD (ventricular septal defect) 03/10/2015    History obtained from: chart review and patient.  Lynn Hale is a 32 y.o. female presenting for a follow up visit.  She was last seen in April 2022.  At that time, we continue with Zyrtec twice daily during flares.  She was otherwise doing very well.  Since  last visit, she has been having more frequent allergy attacks.   Since December 2023, she has had about three of them. She tells me that she is sneezy when she wakes up and then she has runny noses all day and is sneezing all day. She takes the cetirizine daily, but she takes a Benadryl to be "in a coma" when these reactions occur. But she has issues when she starts all over again after awakening from the Benadryl coma. She has not taken an extra cetirizine when this happens. She did just switch to levocetirizine to try to change things up; but again, she has not increased the longer acting antihistamine to BID.   She has a shep-a-doodle for over two years. It does sleep in the bedroom, but on the floor. She never seemed to have worsening symptoms when they the dog entered their lives.   Itching has come and gone. It occurs with the hands and the feet. She has not had hives per say.   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 Lots of things can set her off including perfumes and saw dust. 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 A lot of the reactions occur or at least worsen  in the morning, however.  She has a 6oy daughter. She is in the first grade and has been having some issues with reading. Mom is working very hard to get her doing better, but it is a struggle sometimes.   She was seen in the ER in November for an ovarian cyst rupture.  Otherwise, she has been under fairly good control healthwise.  Otherwise, there have been no changes to her past medical history, surgical history, family history, or social history.    Review of Systems  Constitutional: Negative.  Negative for chills, fever, malaise/fatigue and weight loss.  HENT:  Positive for congestion and sinus pain. Negative for ear discharge and ear pain.        Positive for sneezing. Positive for postnasal drip.   Eyes:  Negative for pain, discharge and redness.   Respiratory:  Negative for cough, sputum production, shortness of breath and wheezing.   Cardiovascular: Negative.  Negative for chest pain and palpitations.  Gastrointestinal:  Negative for abdominal pain, heartburn, nausea and vomiting.  Skin: Negative.  Negative for itching and rash.  Neurological:  Negative for dizziness and headaches.  Endo/Heme/Allergies:  Negative for environmental allergies. Does not bruise/bleed easily.       Objective:   Blood pressure 118/70, pulse (!) 104, temperature 98.3 F (36.8 C), resp. rate 18, height '5\' 4"'$  (1.626 m), weight 209 lb 4 oz (94.9 kg), SpO2 96 %. Body mass index is 35.92 kg/m.    Physical Exam Vitals reviewed.  Constitutional:      Appearance: She is well-developed.  HENT:     Head: Normocephalic and atraumatic.     Right Ear: Tympanic membrane, ear canal and external ear normal.     Left Ear: Tympanic membrane, ear canal and external ear normal.     Nose: No nasal deformity, septal deviation or mucosal edema.     Right Turbinates: Enlarged and swollen. Not pale.     Left Turbinates: Enlarged and swollen. Not pale.     Right Sinus: No maxillary sinus tenderness or frontal sinus tenderness.     Left Sinus: No maxillary sinus tenderness or frontal sinus tenderness.     Comments: Stringy rhinorrhea.     Mouth/Throat:     Mouth: Mucous membranes are not pale and not dry.     Pharynx: Uvula midline.  Eyes:     General:        Right eye: No discharge.        Left eye: No discharge.     Conjunctiva/sclera: Conjunctivae normal.     Right eye: Right conjunctiva is not injected. No chemosis.    Left eye: Left conjunctiva is not injected. No chemosis.    Pupils: Pupils are equal, round, and reactive to light.  Cardiovascular:     Rate and Rhythm: Normal rate and regular rhythm.     Heart sounds: Normal heart sounds.  Pulmonary:     Effort: Pulmonary effort is normal. No tachypnea, accessory muscle usage or respiratory distress.      Breath sounds: Normal breath sounds. No wheezing, rhonchi or rales.  Chest:     Chest wall: No tenderness.  Lymphadenopathy:     Cervical: No cervical adenopathy.  Skin:    Coloration: Skin is not pale.     Findings: No abrasion, erythema, petechiae or rash. Rash is not papular, urticarial or vesicular.  Neurological:     Mental Status: She is alert.  Psychiatric:        Behavior:  Behavior is cooperative.      Diagnostic studies: labs sent instead      Salvatore Marvel, MD  [facility] [place]

## 2022-08-29 LAB — CYTOLOGY - PAP
Comment: NEGATIVE
Diagnosis: NEGATIVE
High risk HPV: NEGATIVE

## 2022-09-11 ENCOUNTER — Ambulatory Visit: Payer: 59

## 2022-09-17 LAB — CBC WITH DIFFERENTIAL
Basophils Absolute: 0 10*3/uL (ref 0.0–0.2)
Basos: 1 %
EOS (ABSOLUTE): 0.2 10*3/uL (ref 0.0–0.4)
Eos: 3 %
Hematocrit: 38.6 % (ref 34.0–46.6)
Hemoglobin: 13.2 g/dL (ref 11.1–15.9)
Immature Grans (Abs): 0 10*3/uL (ref 0.0–0.1)
Immature Granulocytes: 0 %
Lymphocytes Absolute: 3.1 10*3/uL (ref 0.7–3.1)
Lymphs: 38 %
MCH: 31.1 pg (ref 26.6–33.0)
MCHC: 34.2 g/dL (ref 31.5–35.7)
MCV: 91 fL (ref 79–97)
Monocytes Absolute: 0.6 10*3/uL (ref 0.1–0.9)
Monocytes: 7 %
Neutrophils Absolute: 4.2 10*3/uL (ref 1.4–7.0)
Neutrophils: 51 %
RBC: 4.25 x10E6/uL (ref 3.77–5.28)
RDW: 11.6 % — ABNORMAL LOW (ref 11.7–15.4)
WBC: 8.2 10*3/uL (ref 3.4–10.8)

## 2022-09-17 LAB — THYROID ANTIBODIES
Thyroglobulin Antibody: <1 IU/mL (ref 0.0–0.9)
Thyroperoxidase Ab SerPl-aCnc: 11 IU/mL (ref 0–34)

## 2022-09-17 LAB — ALPHA-GAL PANEL
Allergen Lamb IgE: 0.13 kU/L — AB
Beef IgE: 0.11 kU/L — AB
IgE (Immunoglobulin E), Serum: 47 IU/mL (ref 6–495)
O215-IgE Alpha-Gal: <0.1 kU/L
Pork IgE: 0.22 kU/L — AB

## 2022-09-17 LAB — TRYPTASE: Tryptase: 7.9 ug/L (ref 2.2–13.2)

## 2022-11-24 ENCOUNTER — Ambulatory Visit: Payer: 59 | Admitting: Allergy & Immunology

## 2022-11-24 ENCOUNTER — Other Ambulatory Visit: Payer: Self-pay

## 2022-11-24 ENCOUNTER — Encounter: Payer: Self-pay | Admitting: Allergy & Immunology

## 2022-11-24 VITALS — BP 117/70 | HR 100 | Temp 98.0°F | Resp 20 | Ht 64.0 in | Wt 207.0 lb

## 2022-11-24 DIAGNOSIS — L299 Pruritus, unspecified: Secondary | ICD-10-CM

## 2022-11-24 DIAGNOSIS — T7840XD Allergy, unspecified, subsequent encounter: Secondary | ICD-10-CM

## 2022-11-24 DIAGNOSIS — J3089 Other allergic rhinitis: Secondary | ICD-10-CM

## 2022-11-24 MED ORDER — RYALTRIS 665-25 MCG/ACT NA SUSP: 1.0000 | Freq: Two times a day (BID) | NASAL | 1 refills | Status: AC

## 2022-11-24 MED ORDER — LEVOCETIRIZINE DIHYDROCHLORIDE 5 MG PO TABS: 5.0000 mg | ORAL_TABLET | Freq: Two times a day (BID) | ORAL | 1 refills | Status: DC

## 2022-11-24 NOTE — Progress Notes (Signed)
FOLLOW UP  Date of Service/Encounter:  11/24/22   Assessment:   Perennial allergic rhinitis (indoor molds, dust mites, cat and dog)   Allergic reactions - unknown trigger (with slightly reactive alpha gal panel)     Jane is doing very well with her current regimen.  She has a good handle on her symptoms.  I think allergy shots could be useful for her, but she prefers to handle this with medications when it becomes particularly bad.  She does report some chronic rhinitis that she is noticing more more.  We are adding on Ryaltris to see if this helps at all.  Sample provided today.  We did talk about getting an EpiPen in case his symptoms progress, but her reactions have only been limited to her nose and sinuses and never her throat, so she prefers to avoid epinephrine for now.  I also gave her the option of using Afrin twice a day for a few days during these flares, if nothing else plans to get Ryaltris into her sinuses a bit better.  Plan/Recommendations:   1. Perennial allergic rhinitis (indoor molds, dust mites, cat and dog) - Continue taking: Xyzal (levocetirizine)  1-32   - DOUBLE the Xyzal when you have flares in the future. - Start taking: Ryaltris one spray per nostril daily (can increase to twice daily during flares). - Sample provided today.   2. 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 - We are not going to worry about an EpiPen.   3. Return in about 6 months (around 05/26/2023).   Subjective:   GISSELA Hale is a 32 y.o. female presenting today for follow up of  Chief Complaint  Patient presents with   Follow-up    Pt states she had a allergy attack last Saturday from dusting and cleaning but not sure what it came from.    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  Lynn Hale is a 32 y.o. female presenting for a follow up visit.  She was last seen in January 2024.  At that time, she was continuing to have reactions.  We continue with Xyzal daily, increasing to twice daily when she has flares.  We did obtain some labs that showed a positive alpha gal panel, although it was low positive.  We did a tryptase, thyroid test, and a complete blood count which were all normal.  Since last visit, she has done well.  Allergic Rhinitis Symptom History: She did have a reaction over the weekend with eye watering and itching throat. She did have some rhinorrhea in the morning when she woke up but it got worse over the course of the day. She had taken levocetirizine that morning and then she took a Benadryl. She slept and then felt a bit better when she got up. This was on Saurday. She did wake up Sunday and was just dry. In general, her reactions occur infrequently. They occur once every couple of months or so. It might be around every 2-3 months or so.  She never has involvement of her throat.  She  does not think that she needs an EpiPen.  Food Allergy Symptom History: She has cut down on her red meat consumption. Shje has bee ntrying to pay attention to correlation and she never found that it was related.   Otherwise, there have been no changes to her past medical history, surgical history, family history, or social history.    Review of Systems  Constitutional: Negative.  Negative for chills, fever, malaise/fatigue and weight loss.  HENT:  Positive for congestion. Negative for ear discharge and ear pain.        Positive for sneezing. Positive for postnasal drip.  Positive for throat clearing.  Eyes:  Negative for pain, discharge and redness.  Respiratory:   Negative for cough, sputum production, shortness of breath and wheezing.   Cardiovascular: Negative.  Negative for chest pain and palpitations.  Gastrointestinal:  Negative for abdominal pain, heartburn, nausea and vomiting.  Skin: Negative.  Negative for itching and rash.  Neurological:  Negative for dizziness and headaches.  Endo/Heme/Allergies:  Positive for environmental allergies. Does not bruise/bleed easily.       Objective:   Blood pressure 117/70, pulse 100, temperature 98 F (36.7 C), resp. rate 20, height 5\' 4"  (1.626 m), weight 207 lb (93.9 kg), SpO2 96 %. Body mass index is 35.53 kg/m.    Physical Exam Vitals reviewed.  Constitutional:      Appearance: She is well-developed.     Comments: Very talkative.  Lovely.  HENT:     Head: Normocephalic and atraumatic.     Right Ear: Tympanic membrane, ear canal and external ear normal.     Left Ear: Tympanic membrane, ear canal and external ear normal.     Nose: No nasal deformity, septal deviation or mucosal edema.     Right Turbinates: Enlarged, swollen and pale.     Left Turbinates: Enlarged, swollen and pale.     Right Sinus: No maxillary sinus tenderness or frontal sinus tenderness.     Left Sinus: No maxillary sinus tenderness or frontal sinus tenderness.     Comments: Stringy rhinorrhea.     Mouth/Throat:     Lips: Pink.     Mouth: Mucous membranes are moist. Mucous membranes are not pale and not dry.     Pharynx: Uvula midline.     Comments: Moderate cobblestoning Eyes:     General:        Right eye: No discharge.        Left eye: No discharge.     Conjunctiva/sclera: Conjunctivae normal.     Right eye: Right conjunctiva is not injected. No chemosis.    Left eye: Left conjunctiva is not injected. No chemosis.    Pupils: Pupils are equal, round, and reactive to light.  Cardiovascular:     Rate and Rhythm: Normal rate and regular rhythm.     Heart sounds: Normal heart sounds.  Pulmonary:     Effort:  Pulmonary effort is normal. No tachypnea, accessory muscle usage or respiratory distress.     Breath sounds: Normal breath sounds. No wheezing, rhonchi or rales.  Chest:     Chest wall: No tenderness.  Lymphadenopathy:     Cervical: No cervical adenopathy.  Skin:    Coloration: Skin is not pale.     Findings: No abrasion, erythema, petechiae or rash. Rash is not papular, urticarial or vesicular.  Neurological:     Mental Status: She is alert.  Psychiatric:        Behavior: Behavior is cooperative.  Diagnostic studies: none     Salvatore Marvel, MD  [facility]

## 2022-11-24 NOTE — Patient Instructions (Addendum)
1. Perennial allergic rhinitis (indoor molds, dust mites, cat and dog) - Continue taking: Xyzal (levocetirizine) 5mg  1-32   - DOUBLE the Xyzal when you have flares in the future. - Start taking: Ryaltris one spray per nostril daily (can increase to twice daily during flares). - Sample provided today.   2. 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 - We are not going to worry about an EpiPen.   3. Return in about 6 months (around 05/26/2023).    Please inform us of any Emergency Department visits, hospitalizations, or changes in symptoms. Call us before going to the ED for breathing or allergy symptoms since we might be able to fit you in for a sick visit. Feel free to contact us anytime with any questions, problems, or concerns.  It was a pleasure to see you again today!  Websites that have reliable patient information: 1. American Academy of Asthma, Allergy, and Immunology: www.aaaai.org 2. Food Allergy Research and Education (FARE): foodallergy.org 3. Mothers of Asthmatics: http://www.asthmacommunitynetwork.org 4. American College of Allergy, Asthma, and Immunology: www.acaai.org   COVID-19 Vaccine Information can be found at: [email or web address] For questions related to vaccine distribution or appointments, please email [email or web address] or call [phone].   We realize that you might be concerned about having an allergic reaction to the COVID19 vaccines. To help with that concern, WE ARE OFFERING THE COVID19 VACCINES IN OUR OFFICE! Ask the front desk for dates!     "Like" [place] on Facebook and Instagram for our latest updates!      A healthy democracy works best when [employer] participate! Make sure you are registered to vote! If you have moved or changed any of your contact information, you will need to get this updated before voting!  In some cases, you  MAY be able to register to vote online: [email or web address]

## 2023-05-11 ENCOUNTER — Other Ambulatory Visit: Payer: Self-pay | Admitting: Allergy & Immunology

## 2023-06-22 ENCOUNTER — Encounter: Payer: Self-pay | Admitting: Allergy & Immunology

## 2023-06-22 ENCOUNTER — Ambulatory Visit: Payer: 59 | Admitting: Allergy & Immunology

## 2023-06-22 VITALS — BP 112/64 | HR 103 | Temp 97.8°F | Resp 16 | Ht 63.5 in | Wt 215.0 lb

## 2023-06-22 DIAGNOSIS — L299 Pruritus, unspecified: Secondary | ICD-10-CM | POA: Diagnosis not present

## 2023-06-22 DIAGNOSIS — T7840XD Allergy, unspecified, subsequent encounter: Secondary | ICD-10-CM | POA: Diagnosis not present

## 2023-06-22 DIAGNOSIS — J3089 Other allergic rhinitis: Secondary | ICD-10-CM

## 2023-06-22 NOTE — Patient Instructions (Addendum)
1. Perennial allergic rhinitis (indoor molds, dust mites, cat and dog) - Continue taking: Xyzal (levocetirizine) 5mg  1-2 times daily. - DOUBLE the Xyzal when you have flares in the future. - Continue taking: Ryaltris one spray per nostril daily (can increase to twice daily during flares). - Information on allergy shots provided. - If you are going to meet your deductible anyway, this might be a good time to start shots since the largest expense is the first set of vial making. - CPT codes provided.   2. 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  - You seem to have everything under good control.  - We are not going to worry about an EpiPen.   3. Return in about 1 year (around 06/21/2024).    Please inform us of any Emergency Department visits, hospitalizations, or changes in symptoms. Call us before going to the ED for breathing or allergy symptoms since we might be able to fit you in for a sick visit. Feel free to contact us anytime with any questions, problems, or concerns.  It was a pleasure to see you again today! Good luck with your daughter's new diagnosis!   Websites that have reliable patient information: 1. American Academy of Asthma, Allergy, and Immunology: www.aaaai.org 2. Food Allergy Research and Education (FARE): foodallergy.org 3. Mothers of Asthmatics: http://www.asthmacommunitynetwork.org 4. American College of Allergy, Asthma, and Immunology: www.acaai.org   COVID-19 Vaccine Information can be found at: [email or web address] For questions related to vaccine distribution or appointments, please email [email or web address] or call [phone].   We realize that you might be concerned about having an allergic reaction to the COVID19 vaccines. To help with that concern, WE ARE OFFERING THE COVID19 VACCINES IN OUR OFFICE! Ask the front desk for dates!     "Like"  [place] on Facebook and Instagram for our latest updates!      A healthy democracy works best when [employer] participate! Make sure you are registered to vote! If you have moved or changed any of your contact information, you will need to get this updated before voting!  In some cases, you MAY be able to register to vote online: [email or web address]    Allergy Shots  Allergies are the result of a chain reaction that starts in the immune system. Your immune system controls how your body defends itself. For instance, if you have an allergy to pollen, your immune system identifies pollen as an invader or allergen. Your immune system overreacts by producing antibodies called Immunoglobulin E (IgE). These antibodies travel to cells that release chemicals, causing an allergic reaction.  The concept behind allergy immunotherapy, whether it is received in the form of shots or tablets, is that the immune system can be desensitized to specific allergens that trigger allergy symptoms. Although it requires time and patience, the payback can be long-term relief. Allergy injections contain a dilute solution of those substances that you are allergic to based upon your skin testing and allergy history.   How Do Allergy Shots Work?  Allergy shots work much like a vaccine. Your body responds to injected amounts of a particular allergen given in increasing doses, eventually developing a resistance and tolerance to it. Allergy shots can lead to decreased, minimal or no allergy symptoms.  There generally are two phases: build-up and maintenance. Build-up often ranges from three to six months and involves receiving injections with increasing amounts of the allergens. The shots are typically given once or twice a  week, though more rapid build-up schedules are sometimes used.  The maintenance phase begins when the most effective dose is reached. This dose is different for each person, depending  on how allergic you are and your response to the build-up injections. Once the maintenance dose is reached, there are longer periods between injections, typically two to four weeks.  Occasionally doctors give cortisone-type shots that can temporarily reduce allergy symptoms. These types of shots are different and should not be confused with allergy immunotherapy shots.  Who Can Be Treated with Allergy Shots?  Allergy shots may be a good treatment approach for people with allergic rhinitis (hay fever), allergic asthma, conjunctivitis (eye allergy) or stinging insect allergy.   Before deciding to begin allergy shots, you should consider:   The length of allergy season and the severity of your symptoms  Whether medications and/or changes to your environment can control your symptoms  Your desire to avoid long-term medication use  Time: allergy immunotherapy requires a major time commitment  Cost: may vary depending on your insurance coverage  Allergy shots for children age 55 and older are effective and often well tolerated. They might prevent the onset of new allergen sensitivities or the progression to asthma.  Allergy shots are not started on patients who are pregnant but can be continued on patients who become pregnant while receiving them. In some patients with other medical conditions or who take certain common medications, allergy shots may be of risk. It is important to mention other medications you talk to your allergist.   What are the two types of build-ups offered:   RUSH or Rapid Desensitization -- one day of injections lasting from 8:30-4:30pm, injections every 1 hour.  Approximately half of the build-up process is completed in that one day.  The following week, normal build-up is resumed, and this entails ~16 visits either weekly or twice weekly, until reaching your "maintenance dose" which is continued weekly until eventually getting spaced out to every month for a duration of 3  to 5 years. The regular build-up appointments are nurse visits where the injections are administered, followed by required monitoring for 30 minutes.    Traditional build-up -- weekly visits for 6 -12 months until reaching "maintenance dose", then continue weekly until eventually spacing out to every 4 weeks as above. At these appointments, the injections are administered, followed by required monitoring for 30 minutes.     Either way is acceptable, and both are equally effective. With the rush protocol, the advantage is that less time is spent here for injections overall AND you would also reach maintenance dosing faster (which is when the clinical benefit starts to become more apparent). Not everyone is a candidate for rapid desensitization.   IF we proceed with the RUSH protocol, there are premedications which must be taken the day before and the day after the rush only (this includes antihistamines, steroids, and Singulair).  After the rush day, no prednisone or Singulair is required, and we just recommend antihistamines taken on your injection day.  What Is An Estimate of the Costs?  If you are interested in starting allergy injections, please check with your insurance company about your coverage for both allergy vial sets and allergy injections.  Please do so prior to making the appointment to start injections.  The following are CPT codes to give to your insurance company. These are the amounts we BILL to the insurance company, but the amount YOU WILL PAY and WE RECEIVE IS SUBSTANTIALLY LESS  and depends on the contracts we have with different insurance companies.   Amount Billed to Insurance One allergy vial set  CPT 95165   $ 1200       One injection   CPT 95115   $ 35   RUSH (Rapid Desensitization) CPT 95180 x 6 hours  $500/hour  Regarding the allergy injections, your co-pay may or may not apply with each injection, so please confirm this with your insurance company. When you start  allergy injections, 1 or 2 sets of vials are made based on your allergies.  Not all patients can be on one set of vials. A set of vials lasts 6 months to a year depending on how quickly you can proceed with your build-up of your allergy injections. Vials are personalized for each patient depending on their specific allergens.  How often are allergy injection given during the build-up period?   Injections are given at least weekly during the build-up period until your maintenance dose is achieved. Per the doctor's discretion, you may have the option of getting allergy injections two times per week during the build-up period. However, there must be at least 48 hours between injections. The build-up period is usually completed within 6-12 months depending on your ability to schedule injections and for adjustments for reactions. When maintenance dose is reached, your injection schedule is gradually changed to every two weeks and later to every three weeks. Injections will then continue every 4 weeks. Usually, injections are continued for a total of 3-5 years.   When Will I Feel Better?  Some may experience decreased allergy symptoms during the build-up phase. For others, it may take as long as 12 months on the maintenance dose. If there is no improvement after a year of maintenance, your allergist will discuss other treatment options with you.  If you aren't responding to allergy shots, it may be because there is not enough dose of the allergen in your vaccine or there are missing allergens that were not identified during your allergy testing. Other reasons could be that there are high levels of the allergen in your environment or major exposure to non-allergic triggers like tobacco smoke.  What Is the Length of Treatment?  Once the maintenance dose is reached, allergy shots are generally continued for three to five years. The decision to stop should be discussed with your allergist at that time. Some  people may experience a permanent reduction of allergy symptoms. Others may relapse and a longer course of allergy shots can be considered.  What Are the Possible Reactions?  The two types of adverse reactions that can occur with allergy shots are local and systemic. Common local reactions include very mild redness and swelling at the injection site, which can happen immediately or several hours after. Report a delayed reaction from your last injection. These include arm swelling or runny nose, watery eyes or cough that occurs within 12-24 hours after injection. A systemic reaction, which is less common, affects the entire body or a particular body system. They are usually mild and typically respond quickly to medications. Signs include increased allergy symptoms such as sneezing, a stuffy nose or hives.   Rarely, a serious systemic reaction called anaphylaxis can develop. Symptoms include swelling in the throat, wheezing, a feeling of tightness in the chest, nausea or dizziness. Most serious systemic reactions develop within 30 minutes of allergy shots. This is why it is strongly recommended you wait in your doctor's office for 30 minutes after your  injections. Your allergist is trained to watch for reactions, and his or her staff is trained and equipped with the proper medications to identify and treat them.   Report to the nurse immediately if you experience any of the following symptoms: swelling, itching or redness of the skin, hives, watery eyes/nose, breathing difficulty, excessive sneezing, coughing, stomach pain, diarrhea, or light headedness. These symptoms may occur within 15-20 minutes after injection and may require medication.   Who Should Administer Allergy Shots?  The preferred location for receiving shots is your prescribing allergist's office. Injections can sometimes be given at another facility where the physician and staff are trained to recognize and treat reactions, and have  received instructions by your prescribing allergist.  What if I am late for an injection?   Injection dose will be adjusted depending upon how many days or weeks you are late for your injection.   What if I am sick?   Please report any illness to the nurse before receiving injections. She may adjust your dose or postpone injections depending on your symptoms. If you have fever, flu, sinus infection or chest congestion it is best to postpone allergy injections until you are better. Never get an allergy injection if your asthma is causing you problems. If your symptoms persist, seek out medical care to get your health problem under control.  What If I am or Become Pregnant:  Women that become pregnant should schedule an appointment with [facility] before receiving any further allergy injections.

## 2023-06-22 NOTE — Progress Notes (Signed)
FOLLOW UP  Date of Service/Encounter:  06/22/23   Assessment:   Perennial allergic rhinitis (indoor molds, dust mites, cat and dog)   Allergic reactions - unknown trigger (with slightly reactive alpha gal panel)    Daughter recently diagnosed with type 1 diabetes mellitus  Plan/Recommendations:   1. Perennial allergic rhinitis (indoor molds, dust mites, cat and dog) - Continue taking: Xyzal (levocetirizine) 5mg  1-2 times daily. - DOUBLE the Xyzal when you have flares in the future. - Continue taking: Ryaltris one spray per nostril daily (can increase to twice daily during flares). - Information on allergy shots provided. - If you are going to meet your deductible anyway, this might be a good time to start shots since the largest expense is the first set of vial making. - CPT codes provided.   2. 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  - You seem to have everything under good control.  - We are not going to worry about an EpiPen.   3. Return in about 1 year (around 06/21/2024).    Subjective:   Lynn Hale is a 32 y.o. female presenting today for follow up of No chief complaint on file.   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  Discussed the use of AI scribe software for clinical note transcription with the patient and/or guardian, who gave verbal consent to proceed.  Lynn Hale is a 32 y.o. female presenting for a follow up visit.  She was last seen in April 2024.  At that time, we continue with Xyzal 5 mg 1-2 times daily.  We started her on Ryaltris 1 spray per  nostril daily.  For her possible red meat allergy, we recommended limiting red meat although her levels have been very low.  We did not do an EpiPen.  Since last visit, she has done well.   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 The reactions are characterized by a runny nose, sneezing, and rashes, which can last an entire day. The patient manages these symptoms with Xyzal, taken once daily or twice on days when they anticipate exposure to allergens. They also use a nasal spray, which has been effective. The patient has not had any sinus infections recently.  The patient also has a pet, which sheds significantly, contributing to their allergic reactions. Despite this, they have not considered rehoming the pet. They have implemented measures to reduce allergen exposure, including using a vacuum cleaner and dust mite covers on bedding. They are considering the addition of a HEPA filter to further decrease the dander load.  The patient is considering allergy shots to better manage their symptoms, which can be severe and disrupt their daily activities. However, they have not missed work due to these symptoms.   The patient has also been experiencing gastroesophageal reflux disease (GERD) symptoms, which are managed with daily omeprazole.   The patient has made dietary changes, reducing red meat intake and increasing consumption of chicken and fish. They  have not had any severe allergic reactions requiring an EpiPen.  Otherwise, there have been no changes to her past medical history, surgical history, family history, or social history.    Review of systems otherwise negative other than that mentioned in the HPI.    Objective:   Blood pressure 112/64, pulse (!) 103, temperature 97.8 F (36.6 C), resp. rate 16, height 5' 3.5" (1.613  m), weight 215 lb (97.5 kg), SpO2 97%. Body mass index is 37.49 kg/m.    Physical Exam Vitals reviewed.  Constitutional:      Appearance: She is well-developed.     Comments: Very talkative.  Lovely.  HENT:     Head: Normocephalic and atraumatic.     Right Ear: Tympanic membrane, ear canal and external ear normal.     Left Ear: Tympanic membrane, ear canal and external ear normal.     Nose: Mucosal edema and rhinorrhea present. No nasal deformity or septal deviation.     Right Turbinates: Enlarged, swollen and pale.     Left Turbinates: Enlarged, swollen and pale.     Right Sinus: No maxillary sinus tenderness or frontal sinus tenderness.     Left Sinus: No maxillary sinus tenderness or frontal sinus tenderness.     Comments: Stringy rhinorrhea.     Mouth/Throat:     Lips: Pink.     Mouth: Mucous membranes are moist. Mucous membranes are not pale and not dry.     Pharynx: Uvula midline.     Comments: Moderate cobblestoning Eyes:     General:        Right eye: No discharge.        Left eye: No discharge.     Conjunctiva/sclera: Conjunctivae normal.     Right eye: Right conjunctiva is not injected. No chemosis.    Left eye: Left conjunctiva is not injected. No chemosis.    Pupils: Pupils are equal, round, and reactive to light.  Cardiovascular:     Rate and Rhythm: Normal rate and regular rhythm.     Heart sounds: Normal heart sounds.  Pulmonary:     Effort: Pulmonary effort is normal. No tachypnea, accessory muscle usage or respiratory distress.     Breath sounds: Normal breath sounds. No wheezing, rhonchi or rales.  Chest:     Chest wall: No tenderness.  Lymphadenopathy:     Cervical: No cervical adenopathy.  Skin:    Coloration: Skin is not pale.     Findings: No abrasion, erythema, petechiae or rash. Rash is not papular, urticarial or vesicular.  Neurological:     Mental Status: She is alert.  Psychiatric:        Behavior: Behavior is cooperative.       Diagnostic studies:  none     Malachi Bonds, MD  [facility]

## 2024-06-27 ENCOUNTER — Encounter: Payer: Self-pay | Admitting: Allergy & Immunology

## 2024-06-27 ENCOUNTER — Ambulatory Visit: Payer: 59 | Admitting: Allergy & Immunology

## 2024-06-27 VITALS — BP 110/78 | HR 91 | Temp 98.1°F | Ht 64.17 in | Wt 217.4 lb

## 2024-06-27 DIAGNOSIS — L299 Pruritus, unspecified: Secondary | ICD-10-CM

## 2024-06-27 DIAGNOSIS — J3089 Other allergic rhinitis: Secondary | ICD-10-CM

## 2024-06-27 MED ORDER — LEVOCETIRIZINE DIHYDROCHLORIDE 5 MG PO TABS: 5.0000 mg | ORAL_TABLET | Freq: Every day | ORAL | 4 refills | Status: AC

## 2024-06-27 NOTE — Progress Notes (Signed)
 FOLLOW UP  Date of Service/Encounter:  06/27/24   Assessment:   Perennial allergic rhinitis (indoor molds, dust mites, cat and dog) - controlled with antihistamines alone, therefore no need for allergy shots    Allergic reactions - unknown trigger (with slightly reactive alpha gal panel)     Daughter recently diagnosed with type 1 diabetes mellitus  Plan/Recommendations:   1. Perennial allergic rhinitis (indoor molds, dust mites, cat and dog) - Continue taking: Xyzal  (levocetirizine) 5mg  1-2 times daily. - DOUBLE the Xyzal  when you have flares in the future. - Continue taking: Ryaltris  one spray per nostril daily (can increase to twice daily during flares). 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 - but the medications seem to be controlling these well.   2. 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  - You seem to have everything under good control.   3. Return in about 1 year (around 06/27/2025). You can have the follow up appointment with Dr. Iva or a Nurse Practicioner (our Nurse Practitioners are excellent and always have Physician oversight!).    Subjective:   Lynn Hale is a 33 y.o. female presenting today for follow up of  Chief Complaint  Patient presents with   Follow-up    Lynn Hale has a history of the following: Patient Active Problem List   Diagnosis Date Noted   Encounter for gynecological examination with Papanicolaou smear of cervix 08/25/2022   History of ovarian cyst 06/29/2022   LGSIL on Pap smear of cervix 07/30/2018   Family history of VSD (ventricular septal defect) 03/10/2015    History obtained from: chart review and patient.  Discussed the use of AI scribe software for clinical note transcription with the patient and/or guardian, who gave verbal consent to proceed.  Lynn Hale is a 33 y.o. female presenting for a follow up visit. She was  last seen in November 2024.  At that time, we continue with Xyzal  1-2 times daily as well as Ryaltris .  We gave her information on allergy shots.  We recommended with her continuing her red meat exposure.  Since last visit, she has done well.   Allergic Rhinitis Symptom History: Her allergic rhinitis has been well-controlled over the past year. She was previously taking Xyzal  twice a day and using a nasal spray, but she has not needed to continue this regimen. She manages her symptoms with Xyzal  once a day and has not required antibiotics for sinus infections. No recent sinus infections, pneumonia, or ear infections.  Food Allergy Symptom History: Regarding her red meat allergy, she continues to consume red meat but less frequently than before. She does not have an EpiPen  and has not reported any recent allergic reactions.  Her daughter was diagnosed with diabetes last year, following increased thirst and urination. Her daughter was hospitalized for three days in the ICU, where she learned to administer insulin shots. In April, her daughter received her first insulin pump, which was later replaced in June with a different model that has been working well. The pump communicates with her daughter's phone, which she has for medical reasons.  Her social history includes a busy family life with her husband and daughter. She has a sister who is four years younger. Her family dynamics are complex due to multiple divorces and remarriages, leading to a large extended family. Her daughter is 63 years old and has been adjusting  to her diabetes diagnosis with support from friends and community members who also have diabetes.     Otherwise, there have been no changes to her past medical history, surgical history, family history, or social history.    Review of systems otherwise negative other than that mentioned in the HPI.    Objective:   Blood pressure 110/78, pulse 91, temperature 98.1 F (36.7 C),  height 5' 4.17 (1.63 m), weight 217 lb 6 oz (98.6 kg), SpO2 97%. Body mass index is 37.11 kg/m.    Physical Exam Vitals reviewed.  Constitutional:      Appearance: She is well-developed.     Comments: Very talkative.  Lovely. Smiling.   HENT:     Head: Normocephalic and atraumatic.     Right Ear: Tympanic membrane, ear canal and external ear normal.     Left Ear: Tympanic membrane, ear canal and external ear normal.     Nose: Mucosal edema and rhinorrhea present. No nasal deformity or septal deviation.     Right Turbinates: Enlarged, swollen and pale.     Left Turbinates: Enlarged, swollen and pale.     Right Sinus: No maxillary sinus tenderness or frontal sinus tenderness.     Left Sinus: No maxillary sinus tenderness or frontal sinus tenderness.     Comments: Stringy rhinorrhea.     Mouth/Throat:     Lips: Pink.     Mouth: Mucous membranes are moist. Mucous membranes are not pale and not dry.     Pharynx: Uvula midline.     Comments: Moderate cobblestoning Eyes:     General: Allergic shiner present.        Right eye: No discharge.        Left eye: No discharge.     Conjunctiva/sclera: Conjunctivae normal.     Right eye: Right conjunctiva is not injected. No chemosis.    Left eye: Left conjunctiva is not injected. No chemosis.    Pupils: Pupils are equal, round, and reactive to light.  Cardiovascular:     Rate and Rhythm: Normal rate and regular rhythm.     Heart sounds: Normal heart sounds.  Pulmonary:     Effort: Pulmonary effort is normal. No tachypnea, accessory muscle usage or respiratory distress.     Breath sounds: Normal breath sounds. No wheezing, rhonchi or rales.  Chest:     Chest wall: No tenderness.  Lymphadenopathy:     Cervical: No cervical adenopathy.  Skin:    Coloration: Skin is not pale.     Findings: No abrasion, erythema, petechiae or rash. Rash is not papular, urticarial or vesicular.  Neurological:     Mental Status: She is alert.   Psychiatric:        Behavior: Behavior is cooperative.      Diagnostic studies: none      Marty Shaggy, MD  [facility] 

## 2024-06-27 NOTE — Patient Instructions (Addendum)
 1. Perennial allergic rhinitis (indoor molds, dust mites, cat and dog) - Continue taking: Xyzal  (levocetirizine) 5mg  1-2 times daily. - DOUBLE the Xyzal  when you have flares in the future. - Continue taking: Ryaltris  one spray per nostril daily (can increase to twice daily during flares). 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 - but the medications seem to be controlling these well.   2. 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  - You seem to have everything under good control.   3. Return in about 1 year (around 06/27/2025). You can have the follow up appointment with Dr. Iva or a Nurse Practicioner (our Nurse Practitioners are excellent and always have Physician oversight!).    Please inform us  of any Emergency Department visits, hospitalizations, or changes in symptoms. Call us  before going to the ED for breathing or allergy symptoms since we might be able to fit you in for a sick visit. Feel free to contact us  anytime with any questions, problems, or concerns.  It was a pleasure to see you again today!  Websites that have reliable patient information: 1. American Academy of Asthma, Allergy, and Immunology: www.aaaai.org 2. Food Allergy Research and Education (FARE): foodallergy.org 3. Mothers of Asthmatics: http://www.asthmacommunitynetwork.org 4. American College of Allergy, Asthma, and Immunology: www.acaai.org      "Like" us  on Facebook and Instagram for our latest updates!      A healthy democracy works best when [employer] participate! Make sure you are registered to vote! If you have moved or changed any of your contact information, you will need to get this updated before voting! Scan the QR codes below to learn more!

## 2024-08-21 ENCOUNTER — Other Ambulatory Visit: Payer: Self-pay | Admitting: Adult Health

## 2024-08-21 ENCOUNTER — Ambulatory Visit: Admitting: *Deleted

## 2024-08-21 DIAGNOSIS — Z3201 Encounter for pregnancy test, result positive: Secondary | ICD-10-CM | POA: Diagnosis not present

## 2024-08-21 LAB — POCT URINE PREGNANCY: Preg Test, Ur: POSITIVE — AB

## 2024-08-21 MED ORDER — ONDANSETRON HCL 4 MG PO TABS: 4.0000 mg | ORAL_TABLET | Freq: Three times a day (TID) | ORAL | 1 refills | Status: DC | PRN

## 2024-08-21 NOTE — Progress Notes (Signed)
" ° °  NURSE VISIT- PREGNANCY CONFIRMATION   SUBJECTIVE:  KAYDAN WILHOITE is a 34 y.o. G5P1001 female at [redacted]w[redacted]d by uncertain LMP of Patient's last menstrual period was 07/07/2024 (within days). Here for pregnancy confirmation.  Home pregnancy test: positive x 2  She reports nausea.  She is taking prenatal vitamins.    OBJECTIVE:  LMP 07/07/2024 (Within Days)   Appears well, in no apparent distress  Results for orders placed or performed in visit on 08/21/24 (from the past 24 hours)  POCT urine pregnancy   Collection Time: 08/21/24  2:32 PM  Result Value Ref Range   Preg Test, Ur Positive (A) Negative    ASSESSMENT: Positive pregnancy test, [redacted]w[redacted]d by LMP    PLAN: Schedule for dating ultrasound in 1-2 weeks Prenatal vitamins: continue   Nausea medicines: requested-note routed to [facility] to send prescription   OB packet given: Yes  STEPHENIA VOGAN  08/21/2024 2:43 PM  "

## 2024-08-21 NOTE — Progress Notes (Signed)
 Rx zofran

## 2024-09-04 ENCOUNTER — Other Ambulatory Visit: Payer: Self-pay | Admitting: Obstetrics & Gynecology

## 2024-09-04 DIAGNOSIS — O3680X0 Pregnancy with inconclusive fetal viability, not applicable or unspecified: Secondary | ICD-10-CM

## 2024-09-05 ENCOUNTER — Ambulatory Visit

## 2024-09-05 DIAGNOSIS — O3680X0 Pregnancy with inconclusive fetal viability, not applicable or unspecified: Secondary | ICD-10-CM

## 2024-09-05 NOTE — Progress Notes (Signed)
 US  9 wks,single IUP with yolk sac,CRL 23.35 mm,FHR 170 bpm,normal ovaries

## 2024-09-12 ENCOUNTER — Other Ambulatory Visit: Payer: Self-pay | Admitting: Adult Health

## 2024-09-18 ENCOUNTER — Encounter: Admitting: Advanced Practice Midwife

## 2024-09-18 ENCOUNTER — Ambulatory Visit: Admitting: *Deleted

## 2024-10-02 ENCOUNTER — Encounter: Admitting: Women's Health

## 2024-10-02 ENCOUNTER — Ambulatory Visit: Admitting: *Deleted
